# Patient Record
Sex: Female | Born: 1935 | Race: White | Hispanic: No | Smoking: Never smoker
Health system: Southern US, Community
[De-identification: ages and names within clinical notes are randomized; demographics above are authoritative.]

## PROBLEM LIST (undated history)

## (undated) DIAGNOSIS — I4891 Unspecified atrial fibrillation: Secondary | ICD-10-CM

## (undated) DIAGNOSIS — F0390 Unspecified dementia without behavioral disturbance: Secondary | ICD-10-CM

## (undated) DIAGNOSIS — G47419 Narcolepsy without cataplexy: Secondary | ICD-10-CM

## (undated) DIAGNOSIS — I10 Essential (primary) hypertension: Secondary | ICD-10-CM

---

## 2004-01-27 ENCOUNTER — Ambulatory Visit: Payer: Self-pay | Admitting: Family Medicine

## 2004-03-10 ENCOUNTER — Ambulatory Visit: Payer: Self-pay | Admitting: Gastroenterology

## 2005-09-27 ENCOUNTER — Ambulatory Visit: Payer: Self-pay | Admitting: Family Medicine

## 2007-12-17 ENCOUNTER — Ambulatory Visit: Payer: Self-pay | Admitting: Family Medicine

## 2008-01-22 ENCOUNTER — Ambulatory Visit: Payer: Self-pay | Admitting: Family Medicine

## 2009-01-25 ENCOUNTER — Ambulatory Visit: Payer: Self-pay | Admitting: Family Medicine

## 2010-11-22 ENCOUNTER — Inpatient Hospital Stay: Payer: Self-pay | Admitting: Unknown Physician Specialty

## 2010-11-25 LAB — PATHOLOGY REPORT

## 2010-11-29 ENCOUNTER — Encounter: Payer: Self-pay | Admitting: Internal Medicine

## 2010-11-30 ENCOUNTER — Ambulatory Visit: Payer: Self-pay | Admitting: Internal Medicine

## 2010-12-01 ENCOUNTER — Encounter: Payer: Self-pay | Admitting: Internal Medicine

## 2010-12-31 ENCOUNTER — Encounter: Payer: Self-pay | Admitting: Internal Medicine

## 2011-03-15 ENCOUNTER — Encounter: Payer: Self-pay | Admitting: Nurse Practitioner

## 2011-03-15 ENCOUNTER — Encounter: Payer: Self-pay | Admitting: Cardiothoracic Surgery

## 2011-03-31 ENCOUNTER — Encounter: Payer: Self-pay | Admitting: Cardiothoracic Surgery

## 2011-03-31 ENCOUNTER — Encounter: Payer: Self-pay | Admitting: Nurse Practitioner

## 2011-05-01 ENCOUNTER — Encounter: Payer: Self-pay | Admitting: Cardiothoracic Surgery

## 2011-05-01 ENCOUNTER — Encounter: Payer: Self-pay | Admitting: Nurse Practitioner

## 2011-05-31 ENCOUNTER — Encounter: Payer: Self-pay | Admitting: Cardiothoracic Surgery

## 2011-05-31 ENCOUNTER — Encounter: Payer: Self-pay | Admitting: Nurse Practitioner

## 2012-02-22 ENCOUNTER — Emergency Department: Payer: Self-pay | Admitting: Emergency Medicine

## 2012-08-23 IMAGING — NM NM LUNG SCAN
2 series · 16 of 16 positions shown · non-contrast
Comparison: none

REASON FOR EXAM: STAT Lbnat Lii [REDACTED] shortness of breath eval for
PE recent surgery 10...
COMMENTS:

[Series 1000: lung perfusion · 1.95mm/px · 4 acquisitions, 8 frames shown]
[im 1/4]
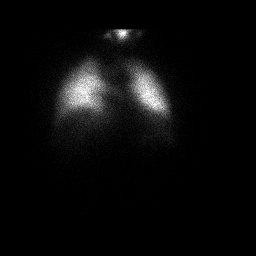
[im 1/4]
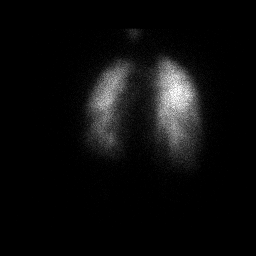
[im 2/4]
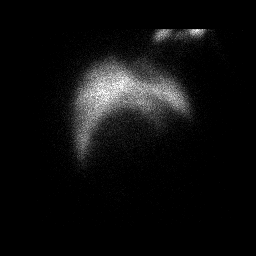
[im 2/4]
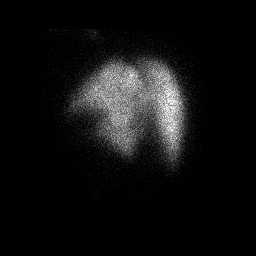
[im 3/4]
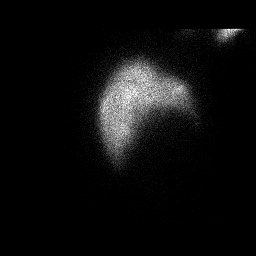
[im 3/4]
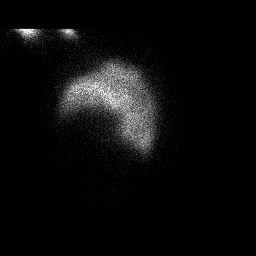
[im 4/4]
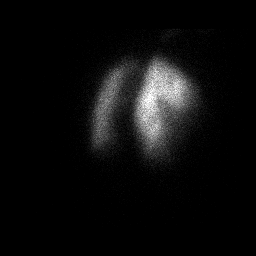
[im 4/4]
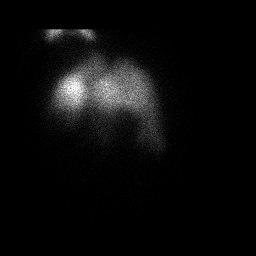

[Series 1000: lung ventilation · 3.90mm/px · 4 acquisitions, 8 frames shown]
[im 1/4]
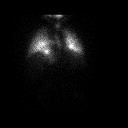
[im 1/4]
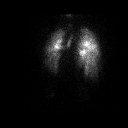
[im 2/4]
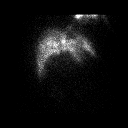
[im 2/4]
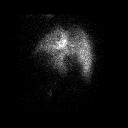
[im 3/4]
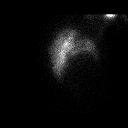
[im 3/4]
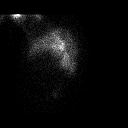
[im 4/4]
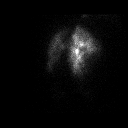
[im 4/4]
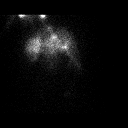

[16 of 16 positions shown; findings below may reference images not displayed]

PROCEDURE:     NM  - NM VQ LUNG SCAN  - [DATE] [DATE] [DATE]  [DATE]

RESULT:     Following aerosol administration of 39.712 mCi of 1255
DTPA, ventilation lung scan was performed in eight views. After the
ventilation scan and following intravenous administration of 4.128 mCi
technetium 99m macroaggregated albumin, perfusion lung scan was performed in
eight views. There is a heterogeneous distribution of tracer activity in the
ventilation scan consistent with ventilatory disease. No mismatched
perfusion defects indicative of pulmonary embolus are identified.
IMPRESSION: 1.     Ventilation perfusion lung scan is low probability for pulmonary
embolus.

## 2014-01-11 ENCOUNTER — Emergency Department: Payer: Self-pay | Admitting: Emergency Medicine

## 2014-01-11 LAB — URINALYSIS, COMPLETE
BACTERIA: NONE SEEN
Bilirubin,UR: NEGATIVE
Glucose,UR: NEGATIVE mg/dL (ref 0–75)
Ketone: NEGATIVE
LEUKOCYTE ESTERASE: NEGATIVE
Nitrite: NEGATIVE
PH: 5 (ref 4.5–8.0)
Protein: NEGATIVE
RBC,UR: 1 /HPF (ref 0–5)
SPECIFIC GRAVITY: 1.01 (ref 1.003–1.030)

## 2014-01-11 LAB — CBC WITH DIFFERENTIAL/PLATELET
BASOS ABS: 0 10*3/uL (ref 0.0–0.1)
BASOS PCT: 0.4 %
EOS ABS: 0.3 10*3/uL (ref 0.0–0.7)
Eosinophil %: 3.9 %
HCT: 44.4 % (ref 35.0–47.0)
HGB: 14.3 g/dL (ref 12.0–16.0)
Lymphocyte #: 3.3 10*3/uL (ref 1.0–3.6)
Lymphocyte %: 40.7 %
MCH: 31.5 pg (ref 26.0–34.0)
MCHC: 32.3 g/dL (ref 32.0–36.0)
MCV: 98 fL (ref 80–100)
Monocyte #: 0.9 x10 3/mm (ref 0.2–0.9)
Monocyte %: 10.7 %
Neutrophil #: 3.6 10*3/uL (ref 1.4–6.5)
Neutrophil %: 44.3 %
Platelet: 272 10*3/uL (ref 150–440)
RBC: 4.55 10*6/uL (ref 3.80–5.20)
RDW: 13.6 % (ref 11.5–14.5)
WBC: 8.2 10*3/uL (ref 3.6–11.0)

## 2014-01-11 LAB — CK: CK, Total: 82 U/L (ref 26–192)

## 2014-01-11 LAB — TROPONIN I: Troponin-I: <0.02 ng/mL

## 2016-01-04 ENCOUNTER — Observation Stay: Admit: 2016-01-04 | Payer: Medicare Other

## 2016-01-04 ENCOUNTER — Encounter: Payer: Self-pay | Admitting: Emergency Medicine

## 2016-01-04 ENCOUNTER — Emergency Department: Payer: Medicare Other

## 2016-01-04 ENCOUNTER — Observation Stay
Admission: EM | Admit: 2016-01-04 | Discharge: 2016-01-05 | Disposition: A | Discharge status: Home / Self Care | Payer: Medicare Other | Attending: Internal Medicine | Admitting: Internal Medicine

## 2016-01-04 DIAGNOSIS — I4891 Unspecified atrial fibrillation: Secondary | ICD-10-CM | POA: Insufficient documentation

## 2016-01-04 DIAGNOSIS — G47419 Narcolepsy without cataplexy: Secondary | ICD-10-CM | POA: Diagnosis not present

## 2016-01-04 DIAGNOSIS — F0390 Unspecified dementia without behavioral disturbance: Secondary | ICD-10-CM | POA: Diagnosis not present

## 2016-01-04 DIAGNOSIS — I10 Essential (primary) hypertension: Secondary | ICD-10-CM | POA: Insufficient documentation

## 2016-01-04 DIAGNOSIS — R079 Chest pain, unspecified: Secondary | ICD-10-CM | POA: Diagnosis not present

## 2016-01-04 DIAGNOSIS — R4182 Altered mental status, unspecified: Secondary | ICD-10-CM | POA: Diagnosis not present

## 2016-01-04 DIAGNOSIS — Z7982 Long term (current) use of aspirin: Secondary | ICD-10-CM | POA: Diagnosis not present

## 2016-01-04 DIAGNOSIS — Z79899 Other long term (current) drug therapy: Secondary | ICD-10-CM | POA: Diagnosis not present

## 2016-01-04 DIAGNOSIS — R55 Syncope and collapse: Secondary | ICD-10-CM | POA: Diagnosis present

## 2016-01-04 HISTORY — DX: Essential (primary) hypertension: I10

## 2016-01-04 HISTORY — DX: Narcolepsy without cataplexy: G47.419

## 2016-01-04 LAB — COMPREHENSIVE METABOLIC PANEL
ALT: 11 U/L — AB (ref 14–54)
AST: 20 U/L (ref 15–41)
Albumin: 3.7 g/dL (ref 3.5–5.0)
Alkaline Phosphatase: 45 U/L (ref 38–126)
Anion gap: 8 (ref 5–15)
BUN: 19 mg/dL (ref 6–20)
CO2: 27 mmol/L (ref 22–32)
CREATININE: 1.07 mg/dL — AB (ref 0.44–1.00)
Calcium: 10.1 mg/dL (ref 8.9–10.3)
Chloride: 102 mmol/L (ref 101–111)
GFR, EST AFRICAN AMERICAN: 52 mL/min — AB (ref >60)
GFR, EST NON AFRICAN AMERICAN: 45 mL/min — AB (ref >60)
Glucose, Bld: 100 mg/dL — ABNORMAL HIGH (ref 65–99)
Potassium: 4.1 mmol/L (ref 3.5–5.1)
Sodium: 137 mmol/L (ref 135–145)
Total Bilirubin: 0.6 mg/dL (ref 0.3–1.2)
Total Protein: 7.3 g/dL (ref 6.5–8.1)

## 2016-01-04 LAB — TROPONIN I
TROPONIN I: 0.03 ng/mL — AB (ref <0.03)
Troponin I: 0.03 ng/mL
Troponin I: <0.03 ng/mL (ref <0.03)

## 2016-01-04 LAB — URINALYSIS, COMPLETE (UACMP) WITH MICROSCOPIC
Bacteria, UA: NONE SEEN
Bilirubin Urine: NEGATIVE
GLUCOSE, UA: NEGATIVE mg/dL
HGB URINE DIPSTICK: NEGATIVE
Ketones, ur: 5 mg/dL — AB
Leukocytes, UA: NEGATIVE
NITRITE: NEGATIVE
PH: 5 (ref 5.0–8.0)
Protein, ur: NEGATIVE mg/dL
Specific Gravity, Urine: 1.017 (ref 1.005–1.030)

## 2016-01-04 LAB — CBC WITH DIFFERENTIAL/PLATELET
BASOS ABS: 0.1 10*3/uL (ref 0–0.1)
Basophils Relative: 1 %
EOS PCT: 2 %
Eosinophils Absolute: 0.2 10*3/uL (ref 0–0.7)
HCT: 43.9 % (ref 35.0–47.0)
Hemoglobin: 14.8 g/dL (ref 12.0–16.0)
LYMPHS PCT: 21 %
Lymphs Abs: 1.9 10*3/uL (ref 1.0–3.6)
MCH: 31.6 pg (ref 26.0–34.0)
MCHC: 33.7 g/dL (ref 32.0–36.0)
MCV: 93.9 fL (ref 80.0–100.0)
Monocytes Absolute: 0.7 10*3/uL (ref 0.2–0.9)
Monocytes Relative: 7 %
NEUTROS ABS: 6.4 10*3/uL (ref 1.4–6.5)
Neutrophils Relative %: 69 %
PLATELETS: 403 10*3/uL (ref 150–440)
RBC: 4.67 MIL/uL (ref 3.80–5.20)
RDW: 13.1 % (ref 11.5–14.5)
WBC: 9.3 10*3/uL (ref 3.6–11.0)

## 2016-01-04 LAB — MRSA PCR SCREENING: MRSA BY PCR: NEGATIVE

## 2016-01-04 MED ORDER — HEPARIN SODIUM (PORCINE) 5000 UNIT/ML IJ SOLN
5000.0000 [IU] | Freq: Three times a day (TID) | INTRAMUSCULAR | Status: DC
  Administered 2016-01-04 – 2016-01-05 (×3): 5000 [IU] via SUBCUTANEOUS
  Filled 2016-01-04 (×3): qty 1

## 2016-01-04 MED ORDER — ALBUTEROL SULFATE (2.5 MG/3ML) 0.083% IN NEBU: 2.5000 mg | INHALATION_SOLUTION | Freq: Four times a day (QID) | RESPIRATORY_TRACT | Status: DC | PRN

## 2016-01-04 MED ORDER — SODIUM CHLORIDE 0.9% FLUSH: 3.0000 mL | Freq: Two times a day (BID) | INTRAVENOUS | Status: DC

## 2016-01-04 MED ORDER — ASPIRIN EC 81 MG PO TBEC
81.0000 mg | DELAYED_RELEASE_TABLET | Freq: Every day | ORAL | Status: DC
  Administered 2016-01-05: 81 mg via ORAL
  Filled 2016-01-04: qty 1

## 2016-01-04 MED ORDER — SODIUM CHLORIDE 0.9 % IV SOLN
INTRAVENOUS | Status: DC
  Administered 2016-01-04: 18:00:00 via INTRAVENOUS

## 2016-01-04 MED ORDER — NYSTATIN 100000 UNIT/GM EX POWD
Freq: Three times a day (TID) | CUTANEOUS | Status: DC
  Administered 2016-01-04 – 2016-01-05 (×3): via TOPICAL
  Filled 2016-01-04: qty 15

## 2016-01-04 NOTE — H&P (Signed)
[facility] - [place] at [facility]   PATIENT NAME: Joy Hill    MR#:  [id number]  DATE OF BIRTH:  22-Feb-1925  DATE OF ADMISSION:  01/04/2016  PRIMARY CARE PHYSICIAN: No primary care provider on file.   REQUESTING/REFERRING PHYSICIAN: Lord  CHIEF COMPLAINT:  No chief complaint on file.   HISTORY OF PRESENT ILLNESS: Joy Hill  is a 82 y.o. female with a known history of  Dementia,  Hypertension-   recently move to assisted living place 15 days ago, daughter goes and visits her every evening. Last evening when daughter and patient was completely back to her baseline status. Today morning after eating breakfast when patient was talking to another resident she suddenly stopped responding and her eyes rolled back. So the other resident called for help and patient was brought to the emergency room. The episode lasted for a few seconds without any loss of control on the urine or bladder. They called patient's daughter and she knows this information as she talked to the assisted living place. Currently patient is alert but as per daughter she is slightly confused. She was also noted to be having atrial fibrillation which daughter was not aware any time in the past.  PAST MEDICAL HISTORY:   Past Medical History:  Diagnosis Date  . Dementia   . Hypertension   . Narcolepsy     PAST SURGICAL HISTORY: History reviewed. No pertinent surgical history.  SOCIAL HISTORY:  Social History  Substance Use Topics  . Smoking status: Unknown If Ever Smoked  . Smokeless tobacco: Not on file  . Alcohol use No    FAMILY HISTORY:  Family History  Problem Relation Age of Onset  . Breast cancer Paternal Grandmother     DRUG ALLERGIES: No Known Allergies  REVIEW OF SYSTEMS:   CONSTITUTIONAL: No fever, fatigue or weakness.  EYES: No blurred or double vision.  EARS, NOSE, AND THROAT: No tinnitus or ear pain.  RESPIRATORY: No cough, shortness of breath, wheezing or  hemoptysis.  CARDIOVASCULAR: No chest pain, orthopnea, edema.  GASTROINTESTINAL: No nausea, vomiting, diarrhea or abdominal pain.  GENITOURINARY: No dysuria, hematuria.  ENDOCRINE: No polyuria, nocturia,  HEMATOLOGY: No anemia, easy bruising or bleeding SKIN: No rash or lesion. MUSCULOSKELETAL: No joint pain or arthritis.   NEUROLOGIC: No tingling, numbness, weakness.  PSYCHIATRY: No anxiety or depression.   MEDICATIONS AT HOME:  Prior to Admission medications   Medication Sig Start Date End Date Taking? Authorizing Provider  albuterol (PROVENTIL HFA;VENTOLIN HFA) 108 (90 Base) MCG/ACT inhaler Inhale 1-2 puffs into the lungs every 4 (four) hours as needed.  07/15/14  Yes Historical Provider, MD  aspirin EC 81 MG tablet Take by mouth daily.    Yes Historical Provider, MD  dextroamphetamine (DEXEDRINE SPANSULE) 10 MG 24 hr capsule Take by mouth. 12/24/15 01/22/16 Yes Historical Provider, MD  nystatin (MYCOSTATIN/NYSTOP) powder Apply topically every 8 (eight) hours as needed.  11/24/15 11/23/16 Yes Historical Provider, MD      PHYSICAL EXAMINATION:   VITAL SIGNS: Blood pressure 106/74, pulse 85, temperature 98.1 F (36.7 C), temperature source Oral, resp. rate 19, SpO2 97 %.  GENERAL:  82 y.o.-year-old patient lying in the bed with no acute distress.  EYES: Pupils equal, round, reactive to light and accommodation. No scleral icterus. Extraocular muscles intact.  HEENT: Head atraumatic, normocephalic. Oropharynx and nasopharynx clear.  NECK:  Supple, no jugular venous distention. No thyroid enlargement, no tenderness.  LUNGS: Normal breath sounds bilaterally, no wheezing, rales,rhonchi or  crepitation. No use of accessory muscles of respiration.  CARDIOVASCULAR: S1, S2 normal. No murmurs, rubs, or gallops.  ABDOMEN: Soft, nontender, nondistended. Bowel sounds present. No organomegaly or mass.  EXTREMITIES: No pedal edema, cyanosis, or clubbing.  NEUROLOGIC: Cranial nerves II through XII  are intact. Muscle strength 4/5 in all extremities. Sensation intact. Gait not checked.  PSYCHIATRIC: The patient is alert and oriented x 2.  SKIN: No obvious rash, lesion, or ulcer.   LABORATORY PANEL:   CBC  Recent Labs Lab 01/04/16 1221  WBC 9.3  HGB 14.8  HCT 43.9  PLT 403  MCV 93.9  MCH 31.6  MCHC 33.7  RDW 13.1  LYMPHSABS 1.9  MONOABS 0.7  EOSABS 0.2  BASOSABS 0.1   ------------------------------------------------------------------------------------------------------------------  Chemistries   Recent Labs Lab 01/04/16 1221  NA 137  K 4.1  CL 102  CO2 27  GLUCOSE 100*  BUN 19  CREATININE 1.07*  CALCIUM 10.1  AST 20  ALT 11*  ALKPHOS 45  BILITOT 0.6   ------------------------------------------------------------------------------------------------------------------ CrCl cannot be calculated (Unknown ideal weight.). ------------------------------------------------------------------------------------------------------------------ No results for input(s): TSH, T4TOTAL, T3FREE, THYROIDAB in the last 72 hours.  Invalid input(s): FREET3   Coagulation profile No results for input(s): INR, PROTIME in the last 168 hours. ------------------------------------------------------------------------------------------------------------------- No results for input(s): DDIMER in the last 72 hours. -------------------------------------------------------------------------------------------------------------------  Cardiac Enzymes  Recent Labs Lab 01/04/16 1221  TROPONINI 0.03*   ------------------------------------------------------------------------------------------------------------------ Invalid input(s): POCBNP  ---------------------------------------------------------------------------------------------------------------  Urinalysis    Component Value Date/Time   COLORURINE YELLOW (A) 01/04/2016 1221   APPEARANCEUR CLEAR (A) 01/04/2016 1221    APPEARANCEUR Clear 01/11/2014 0215   LABSPEC 1.017 01/04/2016 1221   LABSPEC 1.010 01/11/2014 0215   PHURINE 5.0 01/04/2016 1221   GLUCOSEU NEGATIVE 01/04/2016 1221   GLUCOSEU Negative 01/11/2014 0215   HGBUR NEGATIVE 01/04/2016 1221   BILIRUBINUR NEGATIVE 01/04/2016 1221   BILIRUBINUR Negative 01/11/2014 0215   KETONESUR 5 (A) 01/04/2016 1221   PROTEINUR NEGATIVE 01/04/2016 1221   NITRITE NEGATIVE 01/04/2016 1221   LEUKOCYTESUR NEGATIVE 01/04/2016 1221   LEUKOCYTESUR Negative 01/11/2014 0215     RADIOLOGY: Ct Head Wo Contrast  Result Date: 01/04/2016 CLINICAL DATA:  Altered mental status EXAM: CT HEAD WITHOUT CONTRAST TECHNIQUE: Contiguous axial images were obtained from the base of the skull through the vertex without intravenous contrast. COMPARISON:  01/11/2014 FINDINGS: Brain: Diffuse atrophic changes are again identified similar to that noted on the prior exam. No findings to suggest acute hemorrhage, acute infarction or space-occupying mass lesion are noted. Vascular: No hyperdense vessel or unexpected calcification. Skull: Normal. Negative for fracture or focal lesion. Sinuses/Orbits: No acute finding. Other: None. IMPRESSION: Chronic atrophic changes without acute abnormality. Electronically Signed   By: Alcide CleverMark  Lukens M.D.   On: 01/04/2016 13:10    EKG: Orders placed or performed during the hospital encounter of 01/04/16  . EKG 12-Lead  . EKG 12-Lead    IMPRESSION AND PLAN:  * Syncopal episode   Patient has new onset atrial fibrillation, we'll monitor on telemetry and electromyogram.   Give gentle IV hydration.  * New-onset atrial fibrillation   Because of patient's old age she is not a candidate for anticoagulation at this time, CT head is negative. We'll monitor on telemetry and get echocardiogram.  * History of hypertension   As per daughter patient was currently not taking any medication for hypertension.   Continue monitoring.  * yeast underneath both breasts    Nystatin powder.  All the records are reviewed and  case discussed with ED provider. Management plans discussed with the patient, family and they are in agreement.  CODE STATUS: Full code Code Status History    This patient does not have a recorded code status. Please follow your organizational policy for patients in this situation.     Patient's daughter who is healthcare power of attorney was present in the room during my visit.  TOTAL TIME TAKING CARE OF THIS PATIENT: 50 minutes.    Altamese DillingVACHHANI, Shavon Ashmore M.D on 01/04/2016   Between 7am to 6pm - Pager - [phone]  After 6pm go to [email or web address] - Social research officer, governmentpassword EPAS [facility]  [facility]  Office  [phone]  CC: Primary care physician; No primary care provider on file.   Note: This dictation was prepared with Dragon dictation along with smaller phrase technology. Any transcriptional errors that result from this process are unintentional.

## 2016-01-04 NOTE — ED Notes (Signed)
Patient transported to CT 

## 2016-01-04 NOTE — Progress Notes (Signed)
Family Meeting Note  Advance Directive:yes  Today a meeting took place with the daughter- HCPOA.  Patient is unable to participate due [id number]:Lacked capacity confusion   The following clinical team members were present during this meeting:MD and RN  The following were discussed:Patient's diagnosis: , Patient's progosis: Unable to determine and Goals for treatment: Full Code  Additional follow-up to be provided: PMD  Time spent during discussion:20 minutes  Aunica Dauphinee, Heath GoldVAIBHAVKUMAR, MD

## 2016-01-04 NOTE — ED Notes (Signed)
Family at bedside. RN updated

## 2016-01-04 NOTE — ED Notes (Signed)
Report called to floor nurse melonie rn   Pt taken to floor via stretcher.

## 2016-01-04 NOTE — ED Triage Notes (Signed)
Pt to ED via EMS from [facility], original call out was  for unconsciousness, hx of narcolepsy. Per EMS on arrival pt was altered, pt would not stand , was combative during ride. Pt baseline is A&Ox4 per facility. VS 152/70, 84HR, 98% on RA.

## 2016-01-04 NOTE — ED Notes (Signed)
Resumed care from [place] rn. Pt resting in bed with eyes closed.  Family at bedside.  Pt denies any pain.

## 2016-01-04 NOTE — ED Notes (Signed)
Pt awake  Family with pt.   

## 2016-01-04 NOTE — ED Provider Notes (Signed)
[facility] Emergency Department Provider Note ____________________________________________   I have reviewed the triage vital signs and the triage nursing note.  HISTORY  Chief Complaint No chief complaint on file. Altered mental status  Historian Level V cavity, patient is unable to provide her own history/poor historian History per EMS report and speaking with staff at [place] assisted living  HPI Rateel Beldin is a 82 y.o. female who is brought in by EMS with report of altered mental status. It was initially reported to me that the patient was in conversation with someone sitting down and then slumped over. EMS reports when they got there the patient was conscious and alert. They are reporting history of narcolepsy.  Patient herself is unable to state what happened. She states that she does not have narcolepsy. Denies focal weakness or numbness. Denies any pains. Denies chest discomfort.  I spoke with staff member who states that another resident reported they were talking while sitting and she had no warning and just slumped over. Staff member states that she was trying to wake her up and her eyes were rolled back. Took less than 5 minutes and she was conversing again. She's been in the facility for 3 weeks now and has had some trouble adjusting and is having some crying and depression symptoms secondary to that. They did not report facial droop. They thought she was a little confused when she came back from the syncope.    Past Medical History:  Diagnosis Date  . Narcolepsy     There are no active problems to display for this patient.   History reviewed. No pertinent surgical history.  Prior to Admission medications   Medication Sig Start Date End Date Taking? Authorizing Provider  albuterol (PROVENTIL HFA;VENTOLIN HFA) 108 (90 Base) MCG/ACT inhaler Inhale 1-2 puffs into the lungs every 4 (four) hours as needed.  07/15/14  Yes Historical Provider,  MD  aspirin EC 81 MG tablet Take by mouth daily.    Yes Historical Provider, MD  dextroamphetamine (DEXEDRINE SPANSULE) 10 MG 24 hr capsule Take by mouth. 12/24/15 01/22/16 Yes Historical Provider, MD  nystatin (MYCOSTATIN/NYSTOP) powder Apply topically every 8 (eight) hours as needed.  11/24/15 11/23/16 Yes Historical Provider, MD    No Known Allergies  No family history on file.  Social History Social History  Substance Use Topics  . Smoking status: Unknown If Ever Smoked  . Smokeless tobacco: Not on file  . Alcohol use Not on file    Review of Systems  Limited due to patient's mental status, but she denies chest pain, headache, trouble breathing, weakness, numbness, abdominal pain, vomiting  ____________________________________________   PHYSICAL EXAM:  VITAL SIGNS: ED Triage Vitals  Enc Vitals Group     BP 01/04/16 1213 (!) 139/103     Pulse Rate 01/04/16 1213 94     Resp 01/04/16 1213 16     Temp 01/04/16 1213 98.1 F (36.7 C)     Temp Source 01/04/16 1211 Oral     SpO2 01/04/16 1213 98 %     Weight --      Height --      Head Circumference --      Peak Flow --      Pain Score 01/04/16 1214 0     Pain Loc --      Pain Edu? --      Excl. in GC? --      Constitutional: Alert and cooperative, but poor historian. Well appearing and  in no distress. HEENT   Head: Normocephalic and atraumatic.      Eyes: Conjunctivae are normal. PERRL. Normal extraocular movements.      Ears:         Nose: No congestion/rhinnorhea.   Mouth/Throat: Mucous membranes are moist.   Neck: No stridor. Cardiovascular/Chest: Normal rate, irregularly irregular.  No murmurs, rubs, or gallops. Respiratory: Normal respiratory effort without tachypnea nor retractions. Breath sounds are clear and equal bilaterally. No wheezes/rales/rhonchi. Gastrointestinal: Soft. No distention, no guarding, no rebound. Nontender.  Obese.  Genitourinary/rectal: Deferred Musculoskeletal: Nontender  with normal range of motion in all extremities. No joint effusions.  No lower extremity tenderness.  No edema. Neurologic:  No slurred speech. No receptive or impression of aphasia. No facial droop.  No gross or focal neurologic deficits are appreciated. Skin:  Skin is warm, dry and intact. No rash noted. Psychiatric: Labile mood, occasionally tearful, can't state why   ____________________________________________  LABS (pertinent positives/negatives)  Labs Reviewed  URINALYSIS, COMPLETE (UACMP) WITH MICROSCOPIC - Abnormal; Notable for the following:       Result Value   Color, Urine YELLOW (*)    APPearance CLEAR (*)    Ketones, ur 5 (*)    Squamous Epithelial / LPF 0-5 (*)    All other components within normal limits  COMPREHENSIVE METABOLIC PANEL - Abnormal; Notable for the following:    Glucose, Bld 100 (*)    Creatinine, Ser 1.07 (*)    ALT 11 (*)    GFR calc non Af Amer 45 (*)    GFR calc Af Amer 52 (*)    All other components within normal limits  TROPONIN I - Abnormal; Notable for the following:    Troponin I 0.03 (*)    All other components within normal limits  CBC WITH DIFFERENTIAL/PLATELET    ____________________________________________    EKG I, Governor Rooksebecca Ellisa Devivo, MD, the attending physician have personally viewed and interpreted all ECGs.  80 bpm. Atrial fibrillation. Narrow QS. Left axis deviation. Nonspecific ST and T-wave ____________________________________________  RADIOLOGY All Xrays were viewed by me. Imaging interpreted by Radiologist.  CT head without contrast:  IMPRESSION: Chronic atrophic changes without acute abnormality. __________________________________________  PROCEDURES  Procedure(s) performed: None  Critical Care performed: None  ____________________________________________   ED COURSE / ASSESSMENT AND PLAN  Pertinent labs & imaging results that were available during my care of the patient were reviewed by me and considered in my  medical decision making (see chart for details).   By description it sounds like she had a possible syncopal episode while sitting. There is a past medical history noted for narcolepsy, but staff states that she is often difficult to wake when she sleeping, but they have never noted her to have a narcolepsy episode like what occurred today.  No seizure activity. No reported recent illnesses. No reported headache or neurologic complaints, an intact neurologic exam focally, but perhaps she is still altered. Staff states that her emotional state with labile motion is consistent with baseline for them, but the confusion is not.   Patient's daughter, Melody, did come to the ED. She states that her mother is still confused, does not feel like she is at baseline. Again on reexamination, no clear focal neurologic deficit. Head CT reviewed and negative. Laboratory evaluation is reassuring. Troponin initial 0.03. No complaint of chest discomfort or palpitations. She does have a history of A. fib and EKG as showing A. fib without tachycardia.  Given the fact that she is  not back to neurologic baseline, and the unusual syncope, I did discuss with patient and family recommendation for hospital admission. I spoke with the hospitalist for admission.  CONSULTATIONS:  Hospitalist for admission.   Patient / Family / Caregiver informed of clinical course, medical decision-making process, and agree with plan.   ___________________________________________   FINAL CLINICAL IMPRESSION(S) / ED DIAGNOSES   Final diagnoses:  Syncope, unspecified syncope type              Note: This dictation was prepared with Dragon dictation. Any transcriptional errors that result from this process are unintentional    Governor Rooksebecca Charlissa Petros, MD 01/04/16 [phone]

## 2016-01-05 ENCOUNTER — Observation Stay
Admit: 2016-01-05 | Discharge: 2016-01-05 | Disposition: A | Discharge status: Home / Self Care | Payer: Medicare Other | Attending: Internal Medicine | Admitting: Internal Medicine

## 2016-01-05 LAB — BASIC METABOLIC PANEL
ANION GAP: 6 (ref 5–15)
BUN: 16 mg/dL (ref 6–20)
CO2: 26 mmol/L (ref 22–32)
Calcium: 9.5 mg/dL (ref 8.9–10.3)
Chloride: 109 mmol/L (ref 101–111)
Creatinine, Ser: 0.88 mg/dL (ref 0.44–1.00)
GFR, EST NON AFRICAN AMERICAN: 57 mL/min — AB (ref >60)
GLUCOSE: 96 mg/dL (ref 65–99)
POTASSIUM: 3.9 mmol/L (ref 3.5–5.1)
Sodium: 141 mmol/L (ref 135–145)

## 2016-01-05 LAB — CBC
HEMATOCRIT: 37.8 % (ref 35.0–47.0)
HEMOGLOBIN: 13 g/dL (ref 12.0–16.0)
MCH: 32.4 pg (ref 26.0–34.0)
MCHC: 34.5 g/dL (ref 32.0–36.0)
MCV: 93.9 fL (ref 80.0–100.0)
Platelets: 364 10*3/uL (ref 150–440)
RBC: 4.03 MIL/uL (ref 3.80–5.20)
RDW: 13.5 % (ref 11.5–14.5)
WBC: 5.9 10*3/uL (ref 3.6–11.0)

## 2016-01-05 LAB — ECHOCARDIOGRAM COMPLETE
Height: 63 in
Weight: 3313.6 oz

## 2016-01-05 LAB — TROPONIN I: TROPONIN I: 0.03 ng/mL — AB (ref <0.03)

## 2016-01-05 MED ORDER — ACETAMINOPHEN 325 MG PO TABS
650.0000 mg | ORAL_TABLET | ORAL | Status: DC | PRN
  Filled 2016-01-05: qty 2

## 2016-01-05 NOTE — Progress Notes (Signed)
*  PRELIMINARY RESULTS* Echocardiogram 2D Echocardiogram has been performed.  Cristela BlueHege, Liberti Appleton 01/05/2016, 8:40 AM

## 2016-01-05 NOTE — Care Management Obs Status (Signed)
MEDICARE OBSERVATION STATUS NOTIFICATION   Patient Details  Name: Joy Hill MRN: [id number] Date of Birth: 08/04/1925   Medicare Observation Status Notification Given:  Yes    [name], RN 01/05/2016, 10:27 AM

## 2016-01-05 NOTE — Discharge Summary (Signed)
[facility] [place] [facility]   PATIENT NAME: Joy Hill    MR#:  [id number]  DATE OF BIRTH:  Jul 31, 1925  DATE OF ADMISSION:  01/04/2016 ADMITTING PHYSICIAN: Altamese Dilling, MD  DATE OF DISCHARGE: 01/05/16  PRIMARY CARE PHYSICIAN: No primary care provider on file.    ADMISSION DIAGNOSIS:  Syncope, unspecified syncope type [R55]  DISCHARGE DIAGNOSIS:  Principal Problem:   Syncopal episodes Active Problems:   Syncope   SECONDARY DIAGNOSIS:   Past Medical History:  Diagnosis Date  . Hypertension   . Narcolepsy     HOSPITAL COURSE:  Joy Hill  is a 82 y.o. female admitted 01/04/2016 with chief complaint syncope. Please see H&P performed by Altamese Dilling, MD for further information. Patient presented after syncopal episode. No head trauma - per report eyes rolled back then unresponsive while eating. Workup negative for cardiac cause.  Per daughter patient has history of narcolepsy   DISCHARGE CONDITIONS:   stable  CONSULTS OBTAINED:    DRUG ALLERGIES:  No Known Allergies  DISCHARGE MEDICATIONS:   Current Discharge Medication List    CONTINUE these medications which have NOT CHANGED   Details  albuterol (PROVENTIL HFA;VENTOLIN HFA) 108 (90 Base) MCG/ACT inhaler Inhale 1-2 puffs into the lungs every 4 (four) hours as needed.     aspirin EC 81 MG tablet Take by mouth daily.     dextroamphetamine (DEXEDRINE SPANSULE) 10 MG 24 hr capsule Take by mouth.    nystatin (MYCOSTATIN/NYSTOP) powder Apply topically every 8 (eight) hours as needed.          DISCHARGE INSTRUCTIONS:    DIET:  Regular diet  DISCHARGE CONDITION:  Stable  ACTIVITY:  Activity as tolerated  OXYGEN:  Home Oxygen: No.   Oxygen Delivery: room air  DISCHARGE LOCATION:  home   If you experience worsening of your admission symptoms, develop shortness of breath, life threatening emergency, suicidal or homicidal thoughts you must seek  medical attention immediately by calling 911 or calling your MD immediately  if symptoms less severe.  You Must read complete instructions/literature along with all the possible adverse reactions/side effects for all the Medicines you take and that have been prescribed to you. Take any new Medicines after you have completely understood and accpet all the possible adverse reactions/side effects.   Please note  You were cared for by a hospitalist during your hospital stay. If you have any questions about your discharge medications or the care you received while you were in the hospital after you are discharged, you can call the unit and asked to speak with the hospitalist on call if the hospitalist that took care of you is not available. Once you are discharged, your primary care physician will handle any further medical issues. Please note that NO REFILLS for any discharge medications will be authorized once you are discharged, as it is imperative that you return to your primary care physician (or establish a relationship with a primary care physician if you do not have one) for your aftercare needs so that they can reassess your need for medications and monitor your lab values.    On the day of Discharge:   VITAL SIGNS:  Blood pressure 131/71, pulse 69, temperature 97.7 F (36.5 C), temperature source Oral, resp. rate 18, height 5\' 3"  (1.6 m), weight 93.9 kg (207 lb 1.6 oz), SpO2 94 %.  I/O:   Intake/Output Summary (Last 24 hours) at 01/05/16 1019 Last data filed at 01/05/16 1000  Gross per 24 hour  Intake          1641.67 ml  Output                2 ml  Net          1639.67 ml    PHYSICAL EXAMINATION:  GENERAL:  82 y.o.-year-old patient lying in the bed with no acute distress.  EYES: Pupils equal, round, reactive to light and accommodation. No scleral icterus. Extraocular muscles intact.  HEENT: Head atraumatic, normocephalic. Oropharynx and nasopharynx clear.  NECK:  Supple, no  jugular venous distention. No thyroid enlargement, no tenderness.  LUNGS: Normal breath sounds bilaterally, no wheezing, rales,rhonchi or crepitation. No use of accessory muscles of respiration.  CARDIOVASCULAR: S1, S2 normal. No murmurs, rubs, or gallops.  ABDOMEN: Soft, non-tender, non-distended. Bowel sounds present. No organomegaly or mass.  EXTREMITIES: No pedal edema, cyanosis, or clubbing.  NEUROLOGIC: Cranial nerves II through XII are intact. Muscle strength 5/5 in all extremities. Sensation intact. Gait not checked.  PSYCHIATRIC: sleeping but arousable, The patient is alert and oriented x 3.  SKIN: No obvious rash, lesion, or ulcer.   DATA REVIEW:   CBC  Recent Labs Lab 01/05/16 0215  WBC 5.9  HGB 13.0  HCT 37.8  PLT 364    Chemistries   Recent Labs Lab 01/04/16 1221 01/05/16 0215  NA 137 141  K 4.1 3.9  CL 102 109  CO2 27 26  GLUCOSE 100* 96  BUN 19 16  CREATININE 1.07* 0.88  CALCIUM 10.1 9.5  AST 20  --   ALT 11*  --   ALKPHOS 45  --   BILITOT 0.6  --     Cardiac Enzymes  Recent Labs Lab 01/05/16 0215  TROPONINI 0.03*    Microbiology Results  Results for orders placed or performed during the hospital encounter of 01/04/16  MRSA PCR Screening     Status: None   Collection Time: 01/04/16  7:18 PM  Result Value Ref Range Status   MRSA by PCR NEGATIVE NEGATIVE Final    Comment:        The GeneXpert MRSA Assay (FDA approved for NASAL specimens only), is one component of a comprehensive MRSA colonization surveillance program. It is not intended to diagnose MRSA infection nor to guide or monitor treatment for MRSA infections.     RADIOLOGY:  Ct Head Wo Contrast  Result Date: 01/04/2016 CLINICAL DATA:  Altered mental status EXAM: CT HEAD WITHOUT CONTRAST TECHNIQUE: Contiguous axial images were obtained from the base of the skull through the vertex without intravenous contrast. COMPARISON:  01/11/2014 FINDINGS: Brain: Diffuse atrophic changes  are again identified similar to that noted on the prior exam. No findings to suggest acute hemorrhage, acute infarction or space-occupying mass lesion are noted. Vascular: No hyperdense vessel or unexpected calcification. Skull: Normal. Negative for fracture or focal lesion. Sinuses/Orbits: No acute finding. Other: None. IMPRESSION: Chronic atrophic changes without acute abnormality. Electronically Signed   By: Alcide CleverMark  Lukens M.D.   On: 01/04/2016 13:10     Management plans discussed with the patient, family and they are in agreement.  CODE STATUS:     Code Status Orders        Start     Ordered   01/04/16 1706  Full code  Continuous     01/04/16 1705    Code Status History    Date Active Date Inactive Code Status Order ID Comments User Context   This patient has  a current code status but no historical code status.      TOTAL TIME TAKING CARE OF THIS PATIENT: 33 minutes.    Hower,  Mardi MainlandDavid K M.D on 01/05/2016 at 10:19 AM  Between 7am to 6pm - Pager - [phone]  After 6pm go to [email or web address] - Scientist, research (life sciences)password EPAS [facility]  [facility]  Office  [phone]  CC: Primary care physician; No primary care provider on file.

## 2016-01-05 NOTE — Progress Notes (Signed)
Patient is from [facility] and [facility] hours and is ready for D/C. Per Lupita Leashonna RN at [facility] patient can return today and does not require an FL2 because patient is under observation and has been [facility] then [facility] hours. Patient's daughter Meliton RattanMelodie Beard is at bedside and will transport patient back to [facility] today via personal vehicle. RN will call report to Lupita LeashDonna. Please reconsult if future social work needs arise. CSW signing off.   Baker Hughes IncorporatedBailey Dezmon Conover, LCSW [phone]

## 2016-01-05 NOTE — Progress Notes (Signed)
On assessment patient alert and oriented to self (baseline). Patient heart sounds normal and breath sounds clear. Patient is afib on telemetry. Patient has no skin issues. SCD's on and heels off of mattress. Patient is incontinent on urine and bowel.   Report is being called to Maury Dusonna RN and daughter will be taking patient home via private vehicle.  Harvie HeckMelanie Fredrico Beedle, RN

## 2016-02-02 ENCOUNTER — Emergency Department: Payer: Medicare Other

## 2016-02-02 ENCOUNTER — Emergency Department
Admission: EM | Admit: 2016-02-02 | Discharge: 2016-02-02 | Disposition: A | Discharge status: Home / Self Care | Payer: Medicare Other | Attending: Emergency Medicine | Admitting: Emergency Medicine

## 2016-02-02 ENCOUNTER — Encounter: Payer: Self-pay | Admitting: Emergency Medicine

## 2016-02-02 DIAGNOSIS — Z79899 Other long term (current) drug therapy: Secondary | ICD-10-CM | POA: Insufficient documentation

## 2016-02-02 DIAGNOSIS — I10 Essential (primary) hypertension: Secondary | ICD-10-CM | POA: Insufficient documentation

## 2016-02-02 DIAGNOSIS — Z7982 Long term (current) use of aspirin: Secondary | ICD-10-CM | POA: Diagnosis not present

## 2016-02-02 DIAGNOSIS — M79604 Pain in right leg: Secondary | ICD-10-CM | POA: Diagnosis not present

## 2016-02-02 DIAGNOSIS — R52 Pain, unspecified: Secondary | ICD-10-CM

## 2016-02-02 DIAGNOSIS — M79605 Pain in left leg: Secondary | ICD-10-CM

## 2016-02-02 DIAGNOSIS — M25551 Pain in right hip: Secondary | ICD-10-CM | POA: Insufficient documentation

## 2016-02-02 LAB — COMPREHENSIVE METABOLIC PANEL
ALK PHOS: 44 U/L (ref 38–126)
ALT: 9 U/L — ABNORMAL LOW (ref 14–54)
ANION GAP: 5 (ref 5–15)
AST: 19 U/L (ref 15–41)
Albumin: 3.5 g/dL (ref 3.5–5.0)
BUN: 13 mg/dL (ref 6–20)
CALCIUM: 9.5 mg/dL (ref 8.9–10.3)
CO2: 28 mmol/L (ref 22–32)
Chloride: 106 mmol/L (ref 101–111)
Creatinine, Ser: 0.78 mg/dL (ref 0.44–1.00)
Glucose, Bld: 94 mg/dL (ref 65–99)
Potassium: 4.2 mmol/L (ref 3.5–5.1)
SODIUM: 139 mmol/L (ref 135–145)
Total Bilirubin: 1.2 mg/dL (ref 0.3–1.2)
Total Protein: 6.5 g/dL (ref 6.5–8.1)

## 2016-02-02 LAB — CBC
HCT: 40.6 % (ref 35.0–47.0)
Hemoglobin: 13.6 g/dL (ref 12.0–16.0)
MCH: 32 pg (ref 26.0–34.0)
MCHC: 33.5 g/dL (ref 32.0–36.0)
MCV: 95.6 fL (ref 80.0–100.0)
PLATELETS: 309 10*3/uL (ref 150–440)
RBC: 4.24 MIL/uL (ref 3.80–5.20)
RDW: 14.2 % (ref 11.5–14.5)
WBC: 6.9 10*3/uL (ref 3.6–11.0)

## 2016-02-02 LAB — TROPONIN I

## 2016-02-02 MED ORDER — ONDANSETRON HCL 4 MG/2ML IJ SOLN
4.0000 mg | Freq: Once | INTRAMUSCULAR | Status: AC
  Administered 2016-02-02: 4 mg via INTRAVENOUS

## 2016-02-02 MED ORDER — NALOXONE HCL 2 MG/2ML IJ SOSY
1.0000 mg | PREFILLED_SYRINGE | INTRAMUSCULAR | Status: DC | PRN
  Administered 2016-02-02: 1 mg via INTRAVENOUS

## 2016-02-02 MED ORDER — MORPHINE SULFATE (PF) 2 MG/ML IV SOLN
2.0000 mg | Freq: Once | INTRAVENOUS | Status: AC
  Administered 2016-02-02: 2 mg via INTRAVENOUS

## 2016-02-02 MED ORDER — ONDANSETRON HCL 4 MG/2ML IJ SOLN
INTRAMUSCULAR | Status: AC
  Administered 2016-02-02: 4 mg via INTRAVENOUS
  Filled 2016-02-02: qty 2

## 2016-02-02 MED ORDER — MORPHINE SULFATE (PF) 2 MG/ML IV SOLN
INTRAVENOUS | Status: AC
  Administered 2016-02-02: 2 mg via INTRAVENOUS
  Filled 2016-02-02: qty 1

## 2016-02-02 MED ORDER — NALOXONE HCL 2 MG/2ML IJ SOSY
PREFILLED_SYRINGE | INTRAMUSCULAR | Status: AC
  Administered 2016-02-02: 1 mg via INTRAVENOUS
  Filled 2016-02-02: qty 2

## 2016-02-02 NOTE — ED Notes (Signed)
Pt's oxygen saturation dropped to 88%. MD order for narcan; see mar.

## 2016-02-02 NOTE — ED Notes (Signed)
Pt taken off of oxygen via nasal canula, room saturation of 95%.

## 2016-02-02 NOTE — ED Triage Notes (Addendum)
Pt arrived via ems from [facility]. EMS reports pt was found to be in excruciating pain. When asked what is wrong pt responds "I don't know." When asked where pt hurts pt states her legs. Pedal pulses found bilaterally with doppler. Pt's legs painful to touch. Pt is a poor historian. No family at bedside.

## 2016-02-02 NOTE — ED Notes (Signed)
Pt's daughter at bedside and reports facility called ems for chest pain. Will contact facility for a better understanding and more details.

## 2016-02-02 NOTE — ED Notes (Addendum)
Gave report to Misty StanleyLisa, Charity fundraiserN at [facility].

## 2016-02-02 NOTE — ED Notes (Signed)
Facility contacted; nurse reports walking down hall and hearing pt scream when entering room nurse reports asking pt if she had any pain, including chest pain. Pt answered, "yes." EMS was then called by facility.

## 2016-02-02 NOTE — ED Notes (Signed)
Pt's oxygen saturation dropped to 86%, pt placed on 2L via nasal canula.

## 2016-02-02 NOTE — ED Provider Notes (Signed)
[facility] Emergency Department Provider Note    First MD Initiated Contact with Patient 02/02/16 [phone]     (approximate)  I have reviewed the triage vital signs and the nursing notes.  History Limited secondary to dementia breath HISTORY  Chief Complaint Leg Pain    HPI Joy DaviesCarrie Lovvorn is a 83 y.o. female with below list of chronic medical conditions including dementia presents via EMS from [facility] with complaint of lower extremity pain. Patient denies any trauma   Past Medical History:  Diagnosis Date  . Hypertension   . Narcolepsy     Patient Active Problem List   Diagnosis Date Noted  . Syncopal episodes 01/04/2016  . Syncope 01/04/2016    History reviewed. No pertinent surgical history.  Prior to Admission medications   Medication Sig Start Date End Date Taking? Authorizing Provider  acetaminophen (TYLENOL) 500 MG tablet Take 500 mg by mouth daily.   Yes Historical Provider, MD  albuterol (PROVENTIL HFA;VENTOLIN HFA) 108 (90 Base) MCG/ACT inhaler Inhale 1-2 puffs into the lungs every 4 (four) hours as needed.  07/15/14  Yes Historical Provider, MD  aspirin EC 81 MG tablet Take by mouth daily.    Yes Historical Provider, MD  dextroamphetamine (DEXEDRINE SPANSULE) 10 MG 24 hr capsule Take 10 mg by mouth daily.   Yes Historical Provider, MD  nystatin (MYCOSTATIN/NYSTOP) powder Apply topically every 8 (eight) hours as needed.  11/24/15 11/23/16 Yes Historical Provider, MD    Allergies Patient has no known allergies.  Family History  Problem Relation Age of Onset  . Breast cancer Paternal Grandmother     Social History Social History  Substance Use Topics  . Smoking status: Never Smoker  . Smokeless tobacco: Never Used  . Alcohol use No    Review of Systems Constitutional: No fever/chills Eyes: No visual changes. ENT: No sore throat. Cardiovascular: Denies chest pain. Respiratory: Denies shortness of  breath. Gastrointestinal: No abdominal pain.  No nausea, no vomiting.  No diarrhea.  No constipation. Genitourinary: Negative for dysuria. Musculoskeletal: Negative for back pain. Skin: Negative for rash. Neurological: Negative for headaches, focal weakness or numbness.  10-point ROS otherwise negative.  ____________________________________________   PHYSICAL EXAM:  VITAL SIGNS: ED Triage Vitals  Enc Vitals Group     BP 02/02/16 0450 126/83     Pulse Rate 02/02/16 0450 87     Resp 02/02/16 0450 18     Temp 02/02/16 0450 98.1 F (36.7 C)     Temp Source 02/02/16 0450 Oral     SpO2 02/02/16 0450 95 %     Weight 02/02/16 0453 220 lb 3.8 oz (99.9 kg)     Height 02/02/16 0453 5\' 8"  (1.727 m)     Head Circumference --      Peak Flow --      Pain Score 02/02/16 0625 Asleep     Pain Loc --      Pain Edu? --      Excl. in GC? --    Constitutional: Alert  Eyes: Conjunctivae are normal. PERRL. EOMI. Head: Atraumatic. Mouth/Throat: Mucous membranes are moist.  Oropharynx non-erythematous. Neck: No stridor. Cardiovascular: Normal rate, regular rhythm. Good peripheral circulation. Grossly normal heart sounds. Respiratory: Normal respiratory effort.  No retractions. Lungs CTAB. Gastrointestinal: Soft and nontender. No distention.  Musculoskeletal: Pain to palpation bilateral leg and with right hip palpation. No gross deformities of extremities. Neurologic:  Normal speech and language. No gross focal neurologic deficits are appreciated.  Skin:  Skin is warm, dry and intact. No rash noted.   ____________________________________________   LABS (all labs ordered are listed, but only abnormal results are displayed)  Labs Reviewed  COMPREHENSIVE METABOLIC PANEL - Abnormal; Notable for the following:       Result Value   ALT 9 (*)    All other components within normal limits  CBC  TROPONIN I   ____________________________________________  EKG  ED ECG REPORT I, [place] N  BROWN, the attending physician, personally viewed and interpreted this ECG.   Date: 02/02/2016  EKG Time: 4:52 AM  Rate: 86  Rhythm: Atrial fibrillation  Axis: Normal  Intervals: Irregular PR interval  ST&T Change: None  ____________________________________________  RADIOLOGY I, [place] N BROWN, personally viewed and evaluated these images (plain radiographs) as part of my medical decision making, as well as reviewing the written report by the radiologist.  US Venous Img Lower Bilateral  Result Date: 02/02/2016 CLINICAL DATA:  Bilateral lower extremity pain EXAM: BILATERAL LOWER EXTREMITY VENOUS DOPPLER ULTRASOUND TECHNIQUE: Gray-scale sonography with graded compression, as well as color Doppler and duplex ultrasound were performed to evaluate the lower extremity deep venous systems from the level of the common femoral vein and including the common femoral, femoral, profunda femoral, popliteal and calf veins including the posterior tibial, peroneal and gastrocnemius veins when visible. The superficial great saphenous vein was also interrogated. Spectral Doppler was utilized to evaluate flow at rest and with distal augmentation maneuvers in the common femoral, femoral and popliteal veins. COMPARISON:  None. FINDINGS: RIGHT LOWER EXTREMITY Common Femoral Vein: No evidence of thrombus. Normal compressibility, respiratory phasicity and response to augmentation. Saphenofemoral Junction: No evidence of thrombus. Normal compressibility and flow on color Doppler imaging. Profunda Femoral Vein: No evidence of thrombus. Normal compressibility and flow on color Doppler imaging. Femoral Vein: No evidence of thrombus. Normal compressibility, respiratory phasicity and response to augmentation. Popliteal Vein: No evidence of thrombus. Normal compressibility, respiratory phasicity and response to augmentation. Calf Veins: Not well seen. Superficial Great Saphenous Vein: No evidence of thrombus. Normal  compressibility and flow on color Doppler imaging. Venous Reflux:  None. Other Findings:  None. LEFT LOWER EXTREMITY Common Femoral Vein: No evidence of thrombus. Normal compressibility, respiratory phasicity and response to augmentation. Saphenofemoral Junction: No evidence of thrombus. Normal compressibility and flow on color Doppler imaging. Profunda Femoral Vein: No evidence of thrombus. Normal compressibility and flow on color Doppler imaging. Femoral Vein: No evidence of thrombus. Normal compressibility, respiratory phasicity and response to augmentation. Popliteal Vein: No evidence of thrombus. Normal compressibility, respiratory phasicity and response to augmentation. Calf Veins: Not well seen. Superficial Great Saphenous Vein: No evidence of thrombus. Normal compressibility and flow on color Doppler imaging. Venous Reflux:  None. Other Findings:  None. IMPRESSION: No evidence of deep venous thrombosis. Electronically Signed   By: Ellery Plunk M.D.   On: 02/02/2016 06:19   Dg Hip Unilat With Pelvis 2-3 Views Right  Result Date: 02/02/2016 CLINICAL DATA:  Severe RIGHT leg pain, poor historian. EXAM: DG HIP (WITH OR WITHOUT PELVIS) 2-3V RIGHT COMPARISON:  None. FINDINGS: There is no evidence of hip fracture or dislocation. Status post RIGHT hemiarthroplasty, intact well-seated hardware without periprosthetic lucency. Osteopenia without destructive bony lesions. Moderate vascular calcifications. There is no evidence of arthropathy or other focal bone abnormality. IMPRESSION: No acute fracture deformity or dislocation. Status post RIGHT hip hemiarthroplasty without radiographic findings of hardware failure. Electronically Signed   By: Awilda Metro M.D.   On: 02/02/2016 06:30  Procedures      INITIAL IMPRESSION / ASSESSMENT AND PLAN / ED COURSE  Pertinent labs & imaging results that were available during my care of the patient were reviewed by me and considered in my medical decision  making (see chart for details).  No clear etiology for the patient's bilateral external pain identified is resting comfortably at this time without any discomfort. Spoke with the patient's daughter, who agreed with no further testing at this time.   Clinical Course     ____________________________________________  FINAL CLINICAL IMPRESSION(S) / ED DIAGNOSES  Final diagnoses:  Pain in both lower extremities     MEDICATIONS GIVEN DURING THIS VISIT:  Medications  naloxone (NARCAN) injection 1 mg (not administered)  ondansetron (ZOFRAN) injection 4 mg (4 mg Intravenous Given 02/02/16 0526)  morphine 2 MG/ML injection 2 mg (2 mg Intravenous Given 02/02/16 0526)  morphine 2 MG/ML injection 2 mg (2 mg Intravenous Given 02/02/16 0550)     NEW OUTPATIENT MEDICATIONS STARTED DURING THIS VISIT:  New Prescriptions   No medications on file    Modified Medications   No medications on file    Discontinued Medications   DEXTROAMPHETAMINE (DEXEDRINE SPANSULE) 10 MG 24 HR CAPSULE    Take by mouth.     Note:  This document was prepared using Dragon voice recognition software and may include unintentional dictation errors.    Darci Current, MD 02/02/16 [phone]

## 2016-02-29 ENCOUNTER — Telehealth: Payer: Self-pay | Admitting: Cardiovascular Disease

## 2016-02-29 NOTE — Telephone Encounter (Signed)
Received Home Health Certification and Plan of Care  Certification Period: 01/30/16-03/29/16  Signed by Dr. Allyson SabalBerry and mailed back to [facility] Address: [address]; [place], [place]; [id number]

## 2016-03-06 ENCOUNTER — Inpatient Hospital Stay
Admission: EM | Admit: 2016-03-06 | Discharge: 2016-03-09 | DRG: 202 | Disposition: A | Discharge status: Skilled Nursing Facility | Payer: Medicare Other | Attending: Internal Medicine | Admitting: Internal Medicine

## 2016-03-06 ENCOUNTER — Emergency Department: Payer: Medicare Other

## 2016-03-06 DIAGNOSIS — I10 Essential (primary) hypertension: Secondary | ICD-10-CM | POA: Diagnosis present

## 2016-03-06 DIAGNOSIS — J189 Pneumonia, unspecified organism: Secondary | ICD-10-CM

## 2016-03-06 DIAGNOSIS — Z803 Family history of malignant neoplasm of breast: Secondary | ICD-10-CM

## 2016-03-06 DIAGNOSIS — Z7982 Long term (current) use of aspirin: Secondary | ICD-10-CM

## 2016-03-06 DIAGNOSIS — J209 Acute bronchitis, unspecified: Principal | ICD-10-CM | POA: Diagnosis present

## 2016-03-06 DIAGNOSIS — I350 Nonrheumatic aortic (valve) stenosis: Secondary | ICD-10-CM | POA: Diagnosis present

## 2016-03-06 DIAGNOSIS — J9690 Respiratory failure, unspecified, unspecified whether with hypoxia or hypercapnia: Secondary | ICD-10-CM | POA: Diagnosis present

## 2016-03-06 DIAGNOSIS — R0602 Shortness of breath: Secondary | ICD-10-CM | POA: Diagnosis present

## 2016-03-06 DIAGNOSIS — F0390 Unspecified dementia without behavioral disturbance: Secondary | ICD-10-CM | POA: Diagnosis present

## 2016-03-06 DIAGNOSIS — G47419 Narcolepsy without cataplexy: Secondary | ICD-10-CM | POA: Diagnosis present

## 2016-03-06 DIAGNOSIS — R262 Difficulty in walking, not elsewhere classified: Secondary | ICD-10-CM

## 2016-03-06 DIAGNOSIS — I509 Heart failure, unspecified: Secondary | ICD-10-CM

## 2016-03-06 DIAGNOSIS — Z79899 Other long term (current) drug therapy: Secondary | ICD-10-CM

## 2016-03-06 DIAGNOSIS — J9601 Acute respiratory failure with hypoxia: Secondary | ICD-10-CM | POA: Diagnosis present

## 2016-03-06 DIAGNOSIS — M6281 Muscle weakness (generalized): Secondary | ICD-10-CM

## 2016-03-06 DIAGNOSIS — I4891 Unspecified atrial fibrillation: Secondary | ICD-10-CM | POA: Diagnosis present

## 2016-03-06 HISTORY — DX: Unspecified dementia, unspecified severity, without behavioral disturbance, psychotic disturbance, mood disturbance, and anxiety: F03.90

## 2016-03-06 HISTORY — DX: Unspecified atrial fibrillation: I48.91

## 2016-03-06 LAB — CBC WITH DIFFERENTIAL/PLATELET
Basophils Absolute: 0 10*3/uL (ref 0–0.1)
Basophils Relative: 1 %
EOS PCT: 2 %
Eosinophils Absolute: 0.1 10*3/uL (ref 0–0.7)
HEMATOCRIT: 42.1 % (ref 35.0–47.0)
Hemoglobin: 13.7 g/dL (ref 12.0–16.0)
Lymphocytes Relative: 31 %
Lymphs Abs: 2.8 10*3/uL (ref 1.0–3.6)
MCH: 31.2 pg (ref 26.0–34.0)
MCHC: 32.5 g/dL (ref 32.0–36.0)
MCV: 96.1 fL (ref 80.0–100.0)
MONO ABS: 0.8 10*3/uL (ref 0.2–0.9)
MONOS PCT: 9 %
NEUTROS ABS: 5.2 10*3/uL (ref 1.4–6.5)
Neutrophils Relative %: 57 %
PLATELETS: 348 10*3/uL (ref 150–440)
RBC: 4.38 MIL/uL (ref 3.80–5.20)
RDW: 14.6 % — AB (ref 11.5–14.5)
WBC: 9 10*3/uL (ref 3.6–11.0)

## 2016-03-06 LAB — COMPREHENSIVE METABOLIC PANEL
ALT: 15 U/L (ref 14–54)
ANION GAP: 6 (ref 5–15)
AST: 18 U/L (ref 15–41)
Albumin: 3.5 g/dL (ref 3.5–5.0)
Alkaline Phosphatase: 49 U/L (ref 38–126)
BILIRUBIN TOTAL: 0.6 mg/dL (ref 0.3–1.2)
BUN: 13 mg/dL (ref 6–20)
CO2: 30 mmol/L (ref 22–32)
Calcium: 9.8 mg/dL (ref 8.9–10.3)
Chloride: 106 mmol/L (ref 101–111)
Creatinine, Ser: 0.94 mg/dL (ref 0.44–1.00)
GFR, EST NON AFRICAN AMERICAN: 53 mL/min — AB (ref >60)
Glucose, Bld: 100 mg/dL — ABNORMAL HIGH (ref 65–99)
POTASSIUM: 3.9 mmol/L (ref 3.5–5.1)
Sodium: 142 mmol/L (ref 135–145)
TOTAL PROTEIN: 6.8 g/dL (ref 6.5–8.1)

## 2016-03-06 LAB — TROPONIN I: Troponin I: <0.03 ng/mL (ref <0.03)

## 2016-03-06 LAB — INFLUENZA PANEL BY PCR (TYPE A & B)
INFLAPCR: NEGATIVE
INFLBPCR: NEGATIVE

## 2016-03-06 LAB — BRAIN NATRIURETIC PEPTIDE: B Natriuretic Peptide: 229 pg/mL — ABNORMAL HIGH (ref 0.0–100.0)

## 2016-03-06 LAB — TSH: TSH: 1.13 u[IU]/mL (ref 0.350–4.500)

## 2016-03-06 LAB — MRSA PCR SCREENING: MRSA by PCR: NEGATIVE

## 2016-03-06 MED ORDER — IPRATROPIUM-ALBUTEROL 0.5-2.5 (3) MG/3ML IN SOLN
3.0000 mL | Freq: Once | RESPIRATORY_TRACT | Status: AC
  Administered 2016-03-06: 3 mL via RESPIRATORY_TRACT
  Filled 2016-03-06: qty 3

## 2016-03-06 MED ORDER — ALBUTEROL SULFATE HFA 108 (90 BASE) MCG/ACT IN AERS: 2.0000 | INHALATION_SPRAY | RESPIRATORY_TRACT | Status: DC | PRN

## 2016-03-06 MED ORDER — ONDANSETRON HCL 4 MG/2ML IJ SOLN
4.0000 mg | Freq: Four times a day (QID) | INTRAMUSCULAR | Status: DC | PRN
  Administered 2016-03-07: 4 mg via INTRAVENOUS
  Filled 2016-03-06: qty 2

## 2016-03-06 MED ORDER — MUPIROCIN 2 % EX OINT
1.0000 "application " | TOPICAL_OINTMENT | Freq: Two times a day (BID) | CUTANEOUS | Status: DC
  Filled 2016-03-06: qty 22

## 2016-03-06 MED ORDER — ACETAMINOPHEN 650 MG RE SUPP: 650.0000 mg | Freq: Four times a day (QID) | RECTAL | Status: DC | PRN

## 2016-03-06 MED ORDER — HYDROCODONE-ACETAMINOPHEN 5-325 MG PO TABS
1.0000 | ORAL_TABLET | ORAL | Status: DC | PRN
  Administered 2016-03-07: 10:00:00 1 via ORAL
  Filled 2016-03-06: qty 1

## 2016-03-06 MED ORDER — PIPERACILLIN-TAZOBACTAM 3.375 G IVPB
3.3750 g | Freq: Three times a day (TID) | INTRAVENOUS | Status: DC
Start: 2016-03-06 — End: 2016-03-07
  Administered 2016-03-06 – 2016-03-07 (×3): 3.375 g via INTRAVENOUS
  Filled 2016-03-06 (×3): qty 50

## 2016-03-06 MED ORDER — SENNOSIDES-DOCUSATE SODIUM 8.6-50 MG PO TABS
1.0000 | ORAL_TABLET | Freq: Every evening | ORAL | Status: DC | PRN
Start: 2016-03-06 — End: 2016-03-09

## 2016-03-06 MED ORDER — ASPIRIN EC 81 MG PO TBEC
81.0000 mg | DELAYED_RELEASE_TABLET | Freq: Every day | ORAL | Status: DC
  Administered 2016-03-06 – 2016-03-08 (×3): 81 mg via ORAL
  Filled 2016-03-06 (×3): qty 1

## 2016-03-06 MED ORDER — ONDANSETRON HCL 4 MG PO TABS: 4.0000 mg | ORAL_TABLET | Freq: Four times a day (QID) | ORAL | Status: DC | PRN

## 2016-03-06 MED ORDER — ENOXAPARIN SODIUM 40 MG/0.4ML ~~LOC~~ SOLN
40.0000 mg | SUBCUTANEOUS | Status: DC
Start: 2016-03-06 — End: 2016-03-09
  Administered 2016-03-06 – 2016-03-08 (×3): 40 mg via SUBCUTANEOUS
  Filled 2016-03-06 (×3): qty 0.4

## 2016-03-06 MED ORDER — DEXTROAMPHETAMINE SULFATE ER 5 MG PO CP24
10.0000 mg | ORAL_CAPSULE | Freq: Every day | ORAL | Status: DC
  Administered 2016-03-06 – 2016-03-08 (×3): 10 mg via ORAL
  Filled 2016-03-06 (×3): qty 2

## 2016-03-06 MED ORDER — ACETAMINOPHEN 325 MG PO TABS
650.0000 mg | ORAL_TABLET | Freq: Four times a day (QID) | ORAL | Status: DC | PRN
  Administered 2016-03-08: 09:00:00 650 mg via ORAL
  Filled 2016-03-06 (×2): qty 2

## 2016-03-06 MED ORDER — ALBUTEROL SULFATE (2.5 MG/3ML) 0.083% IN NEBU: 2.5000 mg | INHALATION_SOLUTION | RESPIRATORY_TRACT | Status: DC | PRN

## 2016-03-06 MED ORDER — SODIUM CHLORIDE 0.9% FLUSH
3.0000 mL | Freq: Two times a day (BID) | INTRAVENOUS | Status: DC
  Administered 2016-03-06 – 2016-03-08 (×6): 3 mL via INTRAVENOUS

## 2016-03-06 MED ORDER — FUROSEMIDE 10 MG/ML IJ SOLN
40.0000 mg | Freq: Once | INTRAMUSCULAR | Status: AC
  Administered 2016-03-06: 40 mg via INTRAVENOUS
  Filled 2016-03-06: qty 4

## 2016-03-06 MED ORDER — VANCOMYCIN HCL IN DEXTROSE 1-5 GM/200ML-% IV SOLN
1000.0000 mg | INTRAVENOUS | Status: DC
  Administered 2016-03-06: 1000 mg via INTRAVENOUS
  Filled 2016-03-06: qty 200

## 2016-03-06 MED ORDER — VANCOMYCIN HCL IN DEXTROSE 1-5 GM/200ML-% IV SOLN
1000.0000 mg | Freq: Once | INTRAVENOUS | Status: AC
  Administered 2016-03-06: 1000 mg via INTRAVENOUS
  Filled 2016-03-06: qty 200

## 2016-03-06 MED ORDER — CHLORHEXIDINE GLUCONATE CLOTH 2 % EX PADS
6.0000 | MEDICATED_PAD | Freq: Every day | CUTANEOUS | Status: DC
  Filled 2016-03-06: qty 6

## 2016-03-06 MED ORDER — PIPERACILLIN-TAZOBACTAM 3.375 G IVPB 30 MIN: 3.3750 g | Freq: Once | INTRAVENOUS | Status: DC

## 2016-03-06 NOTE — ED Provider Notes (Signed)
[facility] Emergency Department Provider Note  ____________________________________________   First MD Initiated Contact with Patient 03/06/16 [phone]     (approximate)  I have reviewed the triage vital signs and the nursing notes.   HISTORY  Chief Complaint Cough  Level 5 caveat:  history/ROS limited by dementia and respiratory distress  HPI Madilynne Mullan is a 83 y.o. female with a medical history as listed below presents by EMS from her nursing facility for cough and difficulty breathing.  She was seen by her primary care doctorabout three days ago and started on prednisone for some mild expiratory wheezing thought to be secondary to a viral upper respiratory infection.  No additional history is available from the nursing home via EMS but reportedly she is having a much worsening productive cough with copious clear and frothy sputum and increased work of breathing.  Review of systems is limited and the patient will only answer simple questions and not answer review of systems questions.   Past Medical History:  Diagnosis Date  . Atrial fibrillation (HCC)    seen on multiple prior EKGs  . Dementia   . Hypertension   . Narcolepsy     Patient Active Problem List   Diagnosis Date Noted  . Syncopal episodes 01/04/2016  . Syncope 01/04/2016    History reviewed. No pertinent surgical history.  Prior to Admission medications   Medication Sig Start Date End Date Taking? Authorizing Provider  acetaminophen (TYLENOL) 500 MG tablet Take 500 mg by mouth daily.   Yes Historical Provider, MD  albuterol (PROVENTIL HFA;VENTOLIN HFA) 108 (90 Base) MCG/ACT inhaler Inhale 2 puffs into the lungs every 4 (four) hours as needed for shortness of breath.  07/15/14  Yes Historical Provider, MD  aspirin EC 81 MG tablet Take by mouth daily.    Yes Historical Provider, MD  dextroamphetamine (DEXEDRINE SPANSULE) 10 MG 24 hr capsule Take 10 mg by mouth daily.   Yes Historical  Provider, MD  fluticasone (FLONASE) 50 MCG/ACT nasal spray Place 2 sprays into both nostrils daily. 03/03/16 03/03/17 Yes Historical Provider, MD  guaiFENesin (MUCINEX) 600 MG 12 hr tablet Take 600 mg by mouth every 12 (twelve) hours as needed for cough or congestion.   Yes Historical Provider, MD  nystatin (MYCOSTATIN/NYSTOP) powder Apply topically 2 (two) times daily. Apply under breast twice daily for redness. 11/24/15 11/23/16 Yes Historical Provider, MD  predniSONE (DELTASONE) 10 MG tablet Take 10 mg by mouth daily. Take 10 mg once daily for 10 days for upper respiratory infection. 03/05/16 03/14/16 Yes Historical Provider, MD    Allergies Patient has no known allergies.  Family History  Problem Relation Age of Onset  . Breast cancer Paternal Grandmother     Social History Social History  Substance Use Topics  . Smoking status: Never Smoker  . Smokeless tobacco: Never Used  . Alcohol use No    Review of Systems  Level 5 caveat:  history/ROS limited by acute/critical illness ____________________________________________   PHYSICAL EXAM:  ED Triage Vitals  Enc Vitals Group     BP 03/06/16 0549 (!) 173/125     Pulse --      Resp --      Temp 03/06/16 0547 98.3 F (36.8 C)     Temp Source 03/06/16 0547 Oral     SpO2 03/06/16 0549 97 %     Weight 03/06/16 0549 204 lb 8 oz (92.8 kg)     Height 03/06/16 0549 5\' 4"  (1.626 m)  Head Circumference --      Peak Flow --      Pain Score --      Pain Loc --      Pain Edu? --      Excl. in GC? --      Constitutional: Appears acute on chronically ill with increased work of breathing and audible wheezing Eyes: Conjunctivae are normal. PERRL. EOMI. Head: Atraumatic. Nose: No congestion/rhinnorhea. Mouth/Throat: Mucous membranes are moist.   Neck: No stridor.  No meningeal signs.   Cardiovascular: Normal rate, regular rhythm. Good peripheral circulation. Grossly normal heart sounds. Respiratory: increased respiratory effort,  audible wheezing, frequent productive cough (copious clear sputum).   Gastrointestinal: Soft and nontender. No distention.  Musculoskeletal: No lower extremity tenderness nor edema. No gross deformities of extremities. Neurologic:  Normal speech and language. No gross focal neurologic deficits are appreciated.  Skin:  Skin is warm, dry and intact. No rash noted.   ____________________________________________   LABS (all labs ordered are listed, but only abnormal results are displayed)  Labs Reviewed  CBC WITH DIFFERENTIAL/PLATELET - Abnormal; Notable for the following:       Result Value   RDW 14.6 (*)    All other components within normal limits  BRAIN NATRIURETIC PEPTIDE - Abnormal; Notable for the following:    B Natriuretic Peptide 229.0 (*)    All other components within normal limits  COMPREHENSIVE METABOLIC PANEL - Abnormal; Notable for the following:    Glucose, Bld 100 (*)    GFR calc non Af Amer 53 (*)    All other components within normal limits  TROPONIN I  INFLUENZA PANEL BY PCR (TYPE A & B)   ____________________________________________  EKG  ED ECG REPORT I, Louise Rawson, the attending physician, personally viewed and interpreted this ECG.  Date: 03/06/2016 EKG Time: 5:54 AM Rate: 87 Rhythm: Atrial fibrillation with rare PVC QRS Axis: normal Intervals: normal ST/T Wave abnormalities: Non-specific ST segment / T-wave changes, but no evidence of acute ischemia. Conduction Disturbances: none Narrative Interpretation: unremarkable. last several EKGs on record also demonstrate atrial fibrillation  ____________________________________________  RADIOLOGY   Dg Chest Portable 1 View  Result Date: 03/06/2016 CLINICAL DATA:  Shortness of breath and productive cough. Seen last week by primary care physician but cough is worsening. EXAM: PORTABLE CHEST 1 VIEW COMPARISON:  11/30/2010 FINDINGS: Shallow inspiration with elevation of the right hemidiaphragm, similar  to prior study. Borderline heart size with normal pulmonary vascularity. Calcification of the mitral valve annulus. Calcified and tortuous aorta. No focal airspace disease or consolidation in the lungs. No pneumothorax. Surgical clips in the base of the neck. Degenerative changes in the spine and shoulders. IMPRESSION: Shallow inspiration with elevation of the right hemidiaphragm. No evidence of active pulmonary disease. Electronically Signed   By: Burman NievesWilliam  Stevens M.D.   On: 03/06/2016 06:40    ____________________________________________   PROCEDURES  Procedure(s) performed:   Procedures   Critical Care performed: No ____________________________________________   INITIAL IMPRESSION / ASSESSMENT AND PLAN / ED COURSE  Pertinent labs & imaging results that were available during my care of the patient were reviewed by me and considered in my medical decision making (see chart for details).  The patient's initial presentation appears most consistent with CHF exacerbation.  She is hypertensive and has clear frothy sputum and increased work of breathing with audible wheezing.  I will attain a standard workup but [facility].  We will also check  an influenza swab given the probability she will require admission.  DuoNeb for comfort to see if this helps her work of breathing.  I will hold on diuretic until we see a chest x-ray and/or BNP.  She has no significant lower extremity edema but she may still have pulmonary edema.   Clinical Course as of Mar 06 729  Mon Mar 06, 2016  0720 The patient appears like early CHF exacerbation as described above in spite of not having any obvious pulmonary edema (yet) on CXR.  I will provide a dose of Lasix 40 mg IV given the copious secretions.  Discussed by phone with Dr. Juliene Pina who will admit.  [CF]  0730 Influenza pending at the time of admission.  [CF]    Clinical Course User Index [CF] Loleta Rose, MD     ____________________________________________  FINAL CLINICAL IMPRESSION(S) / ED DIAGNOSES  Final diagnoses:  Acute respiratory failure with hypoxemia (HCC)  Congestive heart failure, unspecified congestive heart failure chronicity, unspecified congestive heart failure type (HCC)     MEDICATIONS GIVEN DURING THIS VISIT:  Medications  furosemide (LASIX) injection 40 mg (not administered)  ipratropium-albuterol (DUONEB) 0.5-2.5 (3) MG/3ML nebulizer solution 3 mL (3 mLs Nebulization Given 03/06/16 0648)     NEW OUTPATIENT MEDICATIONS STARTED DURING THIS VISIT:  New Prescriptions   No medications on file    Modified Medications   No medications on file    Discontinued Medications   No medications on file     Note:  This document was prepared using Dragon voice recognition software and may include unintentional dictation errors.    Loleta Rose, MD 03/06/16 [phone]

## 2016-03-06 NOTE — ED Triage Notes (Signed)
Pt from [facility].  Per EMS, pt was diagnosed with cough last week by PCP.  Pt anxious and feeling like cough is worse.  Pt having clear/white sputum coughed up at this time.  Pt tearful and crying off and on.  Per EMS, pt's RA o2 sat was 92-95%.  Pt placed on [facility]LNC for comfort.

## 2016-03-06 NOTE — NC FL2 (Signed)
[place] MEDICAID FL2 LEVEL OF CARE SCREENING TOOL     IDENTIFICATION  Patient Name: Joy DaviesCarrie Hill Birthdate: 03/31/1925 Sex: female Admission Date (Current Location): 03/06/2016  [place]   Facility and Address:  [facility], [address], [place], [place] [address]      Provider Number: [id number]  Attending Physician Name and Address:  Adrian SaranSital Mody, MD  Relative Name and Phone Number:       Current Level of Care: Hospital Recommended Level of Care: SNF  Prior Approval Number:    Date Approved/Denied:   PASRR Number:  [id number] )  Discharge Plan: SNF     Current Diagnoses: Patient Active Problem List   Diagnosis Date Noted  . Respiratory failure (HCC) 03/06/2016  . Syncopal episodes 01/04/2016  . Syncope 01/04/2016    Orientation RESPIRATION BLADDER Height & Weight     Self  O2 (2 Liters Oxygen ) Incontinent Weight: 194 lb 11.2 oz (88.3 kg) Height:  5\' 2"  (157.5 cm)  BEHAVIORAL SYMPTOMS/MOOD NEUROLOGICAL BOWEL NUTRITION STATUS   (none)  (none) Continent Diet (Diet: Soft )  AMBULATORY STATUS COMMUNICATION OF NEEDS Skin   Extensive Assist Verbally Normal                       Personal Care Assistance Level of Assistance  Bathing, Feeding, Dressing Bathing Assistance: Limited assistance Feeding assistance: Limited assistance Dressing Assistance: Limited assistance     Functional Limitations Info  Sight, Hearing, Speech Sight Info: Adequate Hearing Info: Adequate Speech Info: Adequate    SPECIAL CARE FACTORS FREQUENCY  PT (By licensed PT)     PT Frequency:  (2-3 days per week via [facility] )              Contractures      Additional Factors Info  Code Status, Allergies Code Status Info:  (Full Code. ) Allergies Info:  (No Known Allergies. )           Current Medications (03/06/2016):  This is the current hospital active medication list Current  Facility-Administered Medications  Medication Dose Route Frequency Provider Last Rate Last Dose  . acetaminophen (TYLENOL) tablet 650 mg  650 mg Oral Q6H PRN Adrian SaranSital Mody, MD       Or  . acetaminophen (TYLENOL) suppository 650 mg  650 mg Rectal Q6H PRN Sital Mody, MD      . albuterol (PROVENTIL) (2.5 MG/3ML) 0.083% nebulizer solution 2.5 mg  2.5 mg Nebulization Q4H PRN Adrian SaranSital Mody, MD      . aspirin EC tablet 81 mg  81 mg Oral Daily Sital Mody, MD   81 mg at 03/06/16 1058  . dextroamphetamine (DEXEDRINE SPANSULE) 24 hr capsule 10 mg  10 mg Oral Daily Adrian SaranSital Mody, MD   10 mg at 03/06/16 1251  . enoxaparin (LOVENOX) injection 40 mg  40 mg Subcutaneous Q24H Sital Mody, MD      . HYDROcodone-acetaminophen (NORCO/VICODIN) 5-325 MG per tablet 1-2 tablet  1-2 tablet Oral Q4H PRN Adrian SaranSital Mody, MD      . ondansetron (ZOFRAN) tablet 4 mg  4 mg Oral Q6H PRN Adrian SaranSital Mody, MD       Or  . ondansetron (ZOFRAN) injection 4 mg  4 mg Intravenous Q6H PRN Sital Mody, MD      . piperacillin-tazobactam (ZOSYN) IVPB 3.375 g  3.375 g Intravenous Q8H Sital Mody, MD   3.375 g at 03/06/16 1058  . senna-docusate (Senokot-S) tablet  1 tablet  1 tablet Oral QHS PRN Adrian Saran, MD      . sodium chloride flush (NS) 0.9 % injection 3 mL  3 mL Intravenous Q12H Sital Mody, MD   3 mL at 03/06/16 1053  . vancomycin (VANCOCIN) IVPB 1000 mg/200 mL premix  1,000 mg Intravenous Q24H Adrian Saran, MD         Discharge Medications: Please see discharge summary for a list of discharge medications.  Relevant Imaging Results:  Relevant Lab Results:   Additional Information  (SSN: [id number])  Sample, Darleen Crocker, LCSW

## 2016-03-06 NOTE — Progress Notes (Signed)
Pt's MRSA PCR negative.  Made Dr. Juliene PinaMody aware and verbal order to d/c isolation.  Orson Apeanielle Vicci Reder, RN

## 2016-03-06 NOTE — ED Notes (Signed)
Pt placed on droplet precautions

## 2016-03-06 NOTE — ED Notes (Signed)
Report to ashley, rn

## 2016-03-06 NOTE — H&P (Signed)
[facility] [place] [facility]   PATIENT NAME: Joy DaviesCarrie Hill    MR#:  [id number]  DATE OF BIRTH:  03/20/1925  DATE OF ADMISSION:  03/06/2016  PRIMARY CARE PHYSICIAN: No PCP Per Patient   REQUESTING/REFERRING PHYSICIAN: dr York Ceriseforbach  CHIEF COMPLAINT:    Cough and shortness of breath HISTORY OF PRESENT ILLNESS:  Joy DaviesCarrie Costley  is a 83 y.o. female with a known history of Dementia who resents from assisted living due to cough and shortness of breath. Daughter reports last week she saw her primary care physician and was prescribed prednisone and a cough suppressant. Over the past few days she has had increasing cough and shortness of breath. Chest x-ray in emergency room did not show evidence of pneumonia or CHF. She was given 1 dose of Lasix. Influenza testing was negative.  PAST MEDICAL HISTORY:   Past Medical History:  Diagnosis Date  . Atrial fibrillation (HCC)    seen on multiple prior EKGs  . Dementia   . Hypertension   . Narcolepsy     PAST SURGICAL HISTORY:   none SOCIAL HISTORY:   Social History  Substance Use Topics  . Smoking status: Never Smoker  . Smokeless tobacco: Never Used  . Alcohol use No    FAMILY HISTORY:   Family History  Problem Relation Age of Onset  . Breast cancer Paternal Grandmother     DRUG ALLERGIES:  No Known Allergies  REVIEW OF SYSTEMS:   Review of Systems  Unable to perform ROS: Dementia    MEDICATIONS AT HOME:   Prior to Admission medications   Medication Sig Start Date End Date Taking? Authorizing Provider  acetaminophen (TYLENOL) 500 MG tablet Take 500 mg by mouth daily.   Yes Historical Provider, MD  albuterol (PROVENTIL HFA;VENTOLIN HFA) 108 (90 Base) MCG/ACT inhaler Inhale 2 puffs into the lungs every 4 (four) hours as needed for shortness of breath.  07/15/14  Yes Historical Provider, MD  aspirin EC 81 MG tablet Take by mouth daily.    Yes Historical Provider, MD  dextroamphetamine (DEXEDRINE  SPANSULE) 10 MG 24 hr capsule Take 10 mg by mouth daily.   Yes Historical Provider, MD  fluticasone (FLONASE) 50 MCG/ACT nasal spray Place 2 sprays into both nostrils daily. 03/03/16 03/03/17 Yes Historical Provider, MD  guaiFENesin (MUCINEX) 600 MG 12 hr tablet Take 600 mg by mouth every 12 (twelve) hours as needed for cough or congestion.   Yes Historical Provider, MD  nystatin (MYCOSTATIN/NYSTOP) powder Apply topically 2 (two) times daily. Apply under breast twice daily for redness. 11/24/15 11/23/16 Yes Historical Provider, MD  predniSONE (DELTASONE) 10 MG tablet Take 10 mg by mouth daily. Take 10 mg once daily for 10 days for upper respiratory infection. 03/05/16 03/14/16 Yes Historical Provider, MD      VITAL SIGNS:  Blood pressure 109/70, pulse 83, temperature 98.3 F (36.8 C), temperature source Oral, resp. rate (!) 27, height 5\' 4"  (1.626 m), weight 92.8 kg (204 lb 8 oz), SpO2 95 %.  PHYSICAL EXAMINATION:   Physical Exam  Constitutional: She is well-developed, well-nourished, and in no distress. No distress.  HENT:  Head: Normocephalic.  Eyes: No scleral icterus.  Neck: Normal range of motion. Neck supple. No JVD present. No tracheal deviation present.  Cardiovascular: Normal rate, regular rhythm and normal heart sounds.  Exam reveals no gallop and no friction rub.   No murmur heard. Pulmonary/Chest: Effort normal. No respiratory distress. She has no wheezes. She has no rales.  She exhibits no tenderness.  Coarse breath sounds bilaterally  Abdominal: Soft. Bowel sounds are normal. She exhibits no distension and no mass. There is no tenderness. There is no rebound and no guarding.  Musculoskeletal: Normal range of motion. She exhibits no edema.  Neurological: She is alert.  Skin: Skin is warm. No rash noted. No erythema.      LABORATORY PANEL:   CBC  Recent Labs Lab 03/06/16 0558  WBC 9.0  HGB 13.7  HCT 42.1  PLT 348    ------------------------------------------------------------------------------------------------------------------  Chemistries   Recent Labs Lab 03/06/16 0558  NA 142  K 3.9  CL 106  CO2 30  GLUCOSE 100*  BUN 13  CREATININE 0.94  CALCIUM 9.8  AST 18  ALT 15  ALKPHOS 49  BILITOT 0.6   ------------------------------------------------------------------------------------------------------------------  Cardiac Enzymes  Recent Labs Lab 03/06/16 0558  TROPONINI <0.03   ------------------------------------------------------------------------------------------------------------------  RADIOLOGY:  Dg Chest Portable 1 View  Result Date: 03/06/2016 CLINICAL DATA:  Shortness of breath and productive cough. Seen last week by primary care physician but cough is worsening. EXAM: PORTABLE CHEST 1 VIEW COMPARISON:  11/30/2010 FINDINGS: Shallow inspiration with elevation of the right hemidiaphragm, similar to prior study. Borderline heart size with normal pulmonary vascularity. Calcification of the mitral valve annulus. Calcified and tortuous aorta. No focal airspace disease or consolidation in the lungs. No pneumothorax. Surgical clips in the base of the neck. Degenerative changes in the spine and shoulders. IMPRESSION: Shallow inspiration with elevation of the right hemidiaphragm. No evidence of active pulmonary disease. Electronically Signed   By: Burman Nieves M.D.   On: 03/06/2016 06:40    EKG:   Atrial fibrillation heart rate 87  IMPRESSION AND PLAN:    83 year old female with history of atrial fibrillation and dementia who presents with cough and acute hypoxic respiratory failure.  1. Acute hypoxic respiratory failure in the setting of probable HCAP was low BNP and negative influenza testing Start empiric antibiotics with Zosyn and vancomycin Check MRSa PCR and discontinue vancomycin if negative Chest x-ray for a.m. Wean oxygen as tolerated Echocardiogram to value  cardiac function   2. Narcolepsy: Continued Dexedrine   3. History of atrial fibrillation: Heart rate is controlled.  4. Dementia: Patient is at baseline    All the records are reviewed and case discussed with ED provider. Management plans discussed with the patient's daughter and she in agreement  CODE STATUS: FULL  TOTAL TIME TAKING CARE OF THIS PATIENT: 45 minutes.    Hadassa Cermak M.D on 03/06/2016 at 9:35 AM  Between 7am to 6pm - Pager - [phone]  After 6pm go to [email or web address] - Social research officer, government  [facility]  Office  [phone]  CC: Primary care physician; No PCP Per Patient

## 2016-03-06 NOTE — Progress Notes (Signed)
ANTIBIOTIC CONSULT NOTE - INITIAL  Pharmacy Consult for Vancomycin and Zosyn Indication: acute respiratory failure  No Known Allergies  Patient Measurements: Height: 5\' 4"  (162.6 cm) Weight: 204 lb 8 oz (92.8 kg) IBW/kg (Calculated) : 54.7 Adjusted Body Weight:   Vital Signs: Temp: 98.3 F (36.8 C) (02/05 0547) Temp Source: Oral (02/05 0547) BP: 109/70 (02/05 0800) Pulse Rate: 83 (02/05 0800) Intake/Output from previous day: No intake/output data recorded. Intake/Output from this shift: No intake/output data recorded.  Labs:  Recent Labs  03/06/16 0558  WBC 9.0  HGB 13.7  PLT 348  CREATININE 0.94   Estimated Creatinine Clearance: 45.6 mL/min (by C-G formula based on SCr of 0.94 mg/dL). No results for input(s): VANCOTROUGH, VANCOPEAK, VANCORANDOM, GENTTROUGH, GENTPEAK, GENTRANDOM, TOBRATROUGH, TOBRAPEAK, TOBRARND, AMIKACINPEAK, AMIKACINTROU, AMIKACIN in the last 72 hours.   Microbiology: No results found for this or any previous visit (from the past 720 hour(s)).  Medical History: Past Medical History:  Diagnosis Date  . Atrial fibrillation (HCC)    seen on multiple prior EKGs  . Dementia   . Hypertension   . Narcolepsy     Medications:  Prescriptions Prior to Admission  Medication Sig Dispense Refill Last Dose  . acetaminophen (TYLENOL) 500 MG tablet Take 500 mg by mouth daily.   03/05/2016 at 0800  . albuterol (PROVENTIL HFA;VENTOLIN HFA) 108 (90 Base) MCG/ACT inhaler Inhale 2 puffs into the lungs every 4 (four) hours as needed for shortness of breath.    PRN at PRN  . aspirin EC 81 MG tablet Take by mouth daily.    03/05/2016 at 0800  . dextroamphetamine (DEXEDRINE SPANSULE) 10 MG 24 hr capsule Take 10 mg by mouth daily.   03/05/2016 at 0800  . fluticasone (FLONASE) 50 MCG/ACT nasal spray Place 2 sprays into both nostrils daily.   03/05/2016 at 0800  . guaiFENesin (MUCINEX) 600 MG 12 hr tablet Take 600 mg by mouth every 12 (twelve) hours as needed for cough or  congestion.   03/05/2016 at 0950  . nystatin (MYCOSTATIN/NYSTOP) powder Apply topically 2 (two) times daily. Apply under breast twice daily for redness.   03/05/2016 at 2000  . predniSONE (DELTASONE) 10 MG tablet Take 10 mg by mouth daily. Take 10 mg once daily for 10 days for upper respiratory infection.   03/05/2016 at 0800   Scheduled:  . Chlorhexidine Gluconate Cloth  6 each Topical Q0600  . enoxaparin (LOVENOX) injection  40 mg Subcutaneous Q24H  . mupirocin ointment  1 application Nasal BID  . piperacillin-tazobactam (ZOSYN)  IV  3.375 g Intravenous Q8H  . sodium chloride flush  3 mL Intravenous Q12H  . vancomycin  1,000 mg Intravenous Once   Assessment: Pharmacy consulted to dose and monitor Vancomycin and Zosyn therapy in this 83 year old female.  Goal of Therapy:  Vancomycin trough level 15-20 mcg/ml  Plan:  Will start Vancomycin 1 g IV q24 hours 10 hours after the initial dose of Vancomycin. Will also start Zosyn 3.375 g IV q8 hours.   Heman Que D 03/06/2016,9:07 AM

## 2016-03-06 NOTE — Progress Notes (Signed)
Family Meeting Note  Advance Directive:no  Today a meeting took place with the daughter.  Patient is unable to participate due [id number]:Lacked capacity dementia  The following clinical team members were present during this meeting:MD RN  The following were discussed:Patient's diagnosis: , Patient's progosis: Unable to determine and Goals for treatment: Full Code  Additional follow-up to be provided: none  Time spent during discussion:16 minutes  Geralynn Capri, MD

## 2016-03-06 NOTE — Clinical Social Work Note (Signed)
Clinical Social Work Assessment  Patient Details  Name: Joy Hill MRN: [id number] Date of Birth: 1925/12/17  Date of referral:  03/06/16               Reason for consult:  Other (Comment Required) (From [facility] )                Permission sought to share information with:  Facility Art therapist granted to share information::  Yes, Verbal Permission Granted  Name::        Agency::     Relationship::     Contact Information:     Housing/Transportation Living arrangements for the past 2 months:  Joy Hill of Information:  Adult Children, Facility Patient Interpreter Needed:  None Criminal Activity/Legal Involvement Pertinent to Current Situation/Hospitalization:  No - Comment as needed Significant Relationships:  Adult Children Lives with:  Facility Resident Do you feel safe going back to the place where you live?  Yes Need for family participation in patient care:  Yes (Comment)  Care giving concerns:  Patient is a long term care resident at [facility] (fax: [phone]).    Social Worker assessment / plan:  Holiday representative ([place]) received consult that patient is from [facility]. CSW met with patient and her daughter Joy Hill [phone] was at bedside. Patient did not participate in assessment. Per daughter patient has been at [facility] since 12/20/2015 on the ALF side. Per daughter patient walks short distances with a walker and uses a wheel chair for long distances. Daughter reported that patient being on oxygen is new and she is on room air at baseline. Per RN patient will likely be able to wean off the oxygen before discharge. Daughter reported that patient is open to [facility] home health PT at [place]. Per daughter patient will likely needs EMS for transport back to ALF. Daughter prefers for patient to return to [place]. CSW contacted Santiago Glad resident Glass blower/designer at [facility]. Santiago Glad confirmed what the daughter  described and stated that patient can return when stable. FL2 complete, CSW will continue to follow and assist as needed.   Employment status:  Retired Nurse, adult PT Recommendations:  Not assessed at this time Information / Referral to community resources:  Other (Comment Required) (Home Health at ALF )  Patient/Family's Response to care:  Patient's daughter prefers for patient to return to [facility].   Patient/Family's Understanding of and Emotional Response to Diagnosis, Current Treatment, and Prognosis:  Daughter was very pleasant and thanked CSW for visit.   Emotional Assessment Appearance:  Appears stated age Attitude/Demeanor/Rapport:    Affect (typically observed):  Accepting, Adaptable, Pleasant Orientation:  Oriented to Self, Fluctuating Orientation (Suspected and/or reported Sundowners) Alcohol / Substance use:  Not Applicable Psych involvement (Current and /or in the community):  No (Comment)  Discharge Needs  Concerns to be addressed:  Discharge Planning Concerns Readmission within the last 30 days:  No Current discharge risk:  Chronically ill, Cognitively Impaired Barriers to Discharge:  Continued Medical Work up   [employer], Veronia Beets, LCSW 03/06/2016, 2:05 PM

## 2016-03-07 ENCOUNTER — Inpatient Hospital Stay: Payer: Medicare Other

## 2016-03-07 LAB — CBC
HCT: 40.4 % (ref 35.0–47.0)
Hemoglobin: 13.9 g/dL (ref 12.0–16.0)
MCH: 32.4 pg (ref 26.0–34.0)
MCHC: 34.3 g/dL (ref 32.0–36.0)
MCV: 94.4 fL (ref 80.0–100.0)
PLATELETS: 336 10*3/uL (ref 150–440)
RBC: 4.28 MIL/uL (ref 3.80–5.20)
RDW: 14.4 % (ref 11.5–14.5)
WBC: 7.4 10*3/uL (ref 3.6–11.0)

## 2016-03-07 LAB — BASIC METABOLIC PANEL
Anion gap: 8 (ref 5–15)
BUN: 15 mg/dL (ref 6–20)
CHLORIDE: 100 mmol/L — AB (ref 101–111)
CO2: 31 mmol/L (ref 22–32)
CREATININE: 0.84 mg/dL (ref 0.44–1.00)
Calcium: 9.5 mg/dL (ref 8.9–10.3)
GFR calc Af Amer: >60 mL/min (ref >60)
GFR calc non Af Amer: >60 mL/min (ref >60)
Glucose, Bld: 95 mg/dL (ref 65–99)
Potassium: 3.7 mmol/L (ref 3.5–5.1)
SODIUM: 139 mmol/L (ref 135–145)

## 2016-03-07 MED ORDER — AZITHROMYCIN 500 MG PO TABS
500.0000 mg | ORAL_TABLET | Freq: Every day | ORAL | Status: DC
  Administered 2016-03-07 – 2016-03-08 (×2): 500 mg via ORAL
  Filled 2016-03-07 (×2): qty 1

## 2016-03-07 NOTE — Progress Notes (Signed)
Pharmacy Antibiotic Note  Joy Hill is a 83 y.o. female admitted on 03/06/2016 with acute respiratory failure.  Pharmacy has been consulted for Zosyn and Vancomycin dosing. Vancomycin discontinued 03/07/16.  Plan: Zosyn 3.375g IV q8h (4 hour infusion).     Height: 5\' 2"  (157.5 cm) Weight: 193 lb 9 oz (87.8 kg) IBW/kg (Calculated) : 50.1  Temp (24hrs), Avg:98 F (36.7 C), Min:97.6 F (36.4 C), Max:98.2 F (36.8 C)   Recent Labs Lab 03/06/16 0558 03/07/16 0537  WBC 9.0 7.4  CREATININE 0.94 0.84    Estimated Creatinine Clearance: 47.6 mL/min (by C-G formula based on SCr of 0.84 mg/dL).    No Known Allergies  Antimicrobials this admission: Vanc 2/5 >> 2/6 Zosyn 2/5 >>    Dose adjustments this admission:    Microbiology results:   BCx:     UCx:      Sputum:    2/5 MRSA PCR: negative  Thank you for allowing pharmacy to be a part of this patient's care.  Miya Luviano A 03/07/2016 7:35 AM

## 2016-03-07 NOTE — Progress Notes (Signed)
[employer] - [place] at [facility]   PATIENT NAME: Joy Hill    MR#:  [id number]  DATE OF BIRTH:  16-Jan-1926  SUBJECTIVE:  Patient with severe dementia Still with cough  Daughter at bedside  REVIEW OF SYSTEMS:    Review of Systems  Unable to perform ROS: Dementia    Tolerating Diet:yes       DRUG ALLERGIES:  No Known Allergies  VITALS:  Blood pressure 119/61, pulse 90, temperature 98.2 F (36.8 C), temperature source Axillary, resp. rate 16, height 5\' 2"  (1.575 m), weight 87.8 kg (193 lb 9 oz), SpO2 95 %.  PHYSICAL EXAMINATION:   Physical Exam  Constitutional: She is well-developed, well-nourished, and in no distress. No distress.  HENT:  Head: Normocephalic.  Eyes: No scleral icterus.  Neck: Normal range of motion. Neck supple. No JVD present. No tracheal deviation present.  Cardiovascular: Normal rate, regular rhythm and normal heart sounds.  Exam reveals no gallop and no friction rub.   No murmur heard. Pulmonary/Chest: Effort normal and breath sounds normal. No respiratory distress. She has no wheezes. She has no rales. She exhibits no tenderness.  Abdominal: Soft. Bowel sounds are normal. She exhibits no distension and no mass. There is no tenderness. There is no rebound and no guarding.  Musculoskeletal: She exhibits no edema.  Neurological:  sleepy  Skin: Skin is warm. No rash noted. No erythema.      LABORATORY PANEL:   CBC  Recent Labs Lab 03/07/16 0537  WBC 7.4  HGB 13.9  HCT 40.4  PLT 336   ------------------------------------------------------------------------------------------------------------------  Chemistries   Recent Labs Lab 03/06/16 0558 03/07/16 0537  NA 142 139  K 3.9 3.7  CL 106 100*  CO2 30 31  GLUCOSE 100* 95  BUN 13 15  CREATININE 0.94 0.84  CALCIUM 9.8 9.5  AST 18  --   ALT 15  --   ALKPHOS 49  --   BILITOT 0.6  --     ------------------------------------------------------------------------------------------------------------------  Cardiac Enzymes  Recent Labs Lab 03/06/16 0558  TROPONINI <0.03   ------------------------------------------------------------------------------------------------------------------  RADIOLOGY:  X-ray Chest Pa And Lateral  Result Date: 03/07/2016 CLINICAL DATA:  Expiratory failure, productive cough, and wheezing for the past 3 days. History of atrial fibrillation, nonsmoker. EXAM: CHEST  2 VIEW COMPARISON:  Portable chest x-ray of March 06, 2016 and chest x-ray of November 30, 2010. FINDINGS: The right hemidiaphragm is chronically elevated. The interstitial markings of both lungs are coarse but similar to that seen previously. There is no alveolar infiltrate or pleural effusion. The cardiac silhouette is enlarged. There is dense calcification in the mitral valvular annulus. There is calcification in the wall of the thoracic aorta. The pulmonary vascularity is not engorged. There surgical clips in the thyroid bed. The bony thorax exhibits no acute abnormality. IMPRESSION: No acute pneumonia nor pulmonary edema. Chronic elevation of the right hemidiaphragm which limits excursion of the right lung. Mild chronic cardiomegaly. Thoracic aortic atherosclerosis. Electronically Signed   By: David  Swaziland M.D.   On: 03/07/2016 07:56   Dg Chest Portable 1 View  Result Date: 03/06/2016 CLINICAL DATA:  Shortness of breath and productive cough. Seen last week by primary care physician but cough is worsening. EXAM: PORTABLE CHEST 1 VIEW COMPARISON:  11/30/2010 FINDINGS: Shallow inspiration with elevation of the right hemidiaphragm, similar to prior study. Borderline heart size with normal pulmonary vascularity. Calcification of the mitral valve annulus. Calcified and tortuous aorta. No focal airspace disease or consolidation in  the lungs. No pneumothorax. Surgical clips in the base of the neck.  Degenerative changes in the spine and shoulders. IMPRESSION: Shallow inspiration with elevation of the right hemidiaphragm. No evidence of active pulmonary disease. Electronically Signed   By: Burman NievesWilliam  Stevens M.D.   On: 03/06/2016 06:40     ASSESSMENT AND PLAN:   83 year old female with history of atrial fibrillation and dementia who presents with cough and acute hypoxic respiratory failure.  1. Acute hypoxic respiratory failure in the setting of acute bronchitis with negative CXR for CHF/PNA. Stop VANC and ZOSY and start PO AZITHROMYCIN  Follow up on ECHO   2. Narcolepsy: Continued Dexedrine   3. History of atrial fibrillation: Heart rate is controlled.  4. Dementia: Patient is at baseline   PT consult for dispo       Management plans discussed with the patient's daughter and she is in agreement.  CODE STATUS: FULL  TOTAL TIME TAKING CARE OF THIS PATIENT: 26 minutes.     POSSIBLE D/C tomorrow, DEPENDING ON CLINICAL CONDITION.   Joy Hill M.D on 03/07/2016 at 12:49 PM  Between 7am to 6pm - Pager - [phone] After 6pm go to [email or web address] - Social research officer, governmentpassword EPAS [facility]  [facility]  Office  [phone]  CC: Primary care physician; No PCP Per Patient  Note: This dictation was prepared with Dragon dictation along with smaller phrase technology. Any transcriptional errors that result from this process are unintentional.

## 2016-03-07 NOTE — Progress Notes (Signed)
PT Cancellation Note  Patient Details Name: Joy DaviesCarrie Hill MRN: [id number] DOB: 01/22/1926   Cancelled Treatment:    Reason Eval/Treat Not Completed: Patient declined, no reason specified.  Pt initially adamantly stating that she did not want to eat anything and requiring redirection to discuss role of PT and OOB mobility.  Pt then started to cry and refusing to do anything.  PT able to calm pt down but pt adamant on not doing anything with PT (even when redirected with more automatic functional activities).  Pt intermittently falling alseep (pt with PMH of narcolepsy).  Nursing reporting pt does better overall when her daughter is around.  Called pt's daughter [name] who reported she was working today but could be at the hospital at 10 AM tomorrow (Wednesday February 7th).  Will re-attempt PT session tomorrow morning with pt's daughter present.  Irving Burtonmily Trashawn Oquendo 03/07/2016, 1:28 PM Hendricks LimesEmily Lashunda Greis, PT [phone]

## 2016-03-08 NOTE — Clinical Social Work Placement (Signed)
   CLINICAL SOCIAL WORK PLACEMENT  NOTE  Date:  03/08/2016  Patient Details  Name: Joy Hill MRN: [id number] Date of Birth: 05/20/1925  Clinical Social Work is seeking post-discharge placement for this patient at the Skilled  Nursing Facility level of care (*CSW will initial, date and re-position this form in  chart as items are completed):  Yes   Patient/family provided with [facility] Clinical Social Work Department's list of facilities offering this level of care within the geographic area requested by the patient (or if unable, by the patient's family).  Yes   Patient/family informed of their freedom to choose among providers that offer the needed level of care, that participate in Medicare, Medicaid or managed care program needed by the patient, have an available bed and are willing to accept the patient.  Yes   Patient/family informed of [facility]'s ownership interest in [facility] and [facility], as well as of the fact that they are under no obligation to receive care at these facilities.  PASRR submitted to EDS on       PASRR number received on       Existing PASRR number confirmed on 03/06/16     FL2 transmitted to all facilities in geographic area requested by pt/family on 03/08/16     FL2 transmitted to all facilities within larger geographic area on       Patient informed that his/her managed care company has contracts with or will negotiate with certain facilities, including the following:            Patient/family informed of bed offers received.  Patient chooses bed at       Physician recommends and patient chooses bed at      Patient to be transferred to   on  .  Patient to be transferred to facility by       Patient family notified on   of transfer.  Name of family member notified:        PHYSICIAN       Additional Comment:    _______________________________________________ Theo Reither, Darleen CrockerBailey M, LCSW 03/08/2016, 1:34 PM

## 2016-03-08 NOTE — Progress Notes (Addendum)
PT is recommending SNF. Clinical Social Worker (CSW) contacted patient's daughter [name] and made her aware of above. Daughter [place] agreeable to [place] in [place]. FL2 complete and faxed out. CSW will continue to follow and assist as needed.   CSW contacted patient's daughter and presented bed offers. [facility]. [facility] a voicemail making her aware of accepted bed offer.   [employer] Hughes [employer]orporatedBailey Bashar Milam, LCSW [phone]

## 2016-03-08 NOTE — Evaluation (Signed)
Physical Therapy Evaluation Patient Details Name: Joy Hill MRN: [id number] DOB: Jul 02, 1925 Today's Date: 03/08/2016   History of Present Illness  Pt is an 83 y.o. female presenting to hospital with cough and SOB.  Pt admitted to hospital with acute hypoxic respiratory failure in setting of probably HCAP.  PMH includes a-fib, dementia, htn, narcolepsy, and syncope.  Clinical Impression  Prior to hospital admission, pt was modified independent ambulating short distances with RW but was pushed in manual w/c for longer distances (to dining hall).  Pt lives at [facility].  Currently pt is mod to max assist supine to sit and min to mod assist to stand and take a few steps bed to recliner using RW.  Ambulation distance limited d/t pt's overall weakness, decreased activity tolerance, and coughing during activity.  Pt would benefit from skilled PT to address noted impairments and functional limitations.  Recommend pt discharge to STR when medically appropriate.    Follow Up Recommendations SNF    Equipment Recommendations  Other (comment) (pt already has RW and manual w/c)    Recommendations for Other Services       Precautions / Restrictions Precautions Precautions: Fall Restrictions Weight Bearing Restrictions: No      Mobility  Bed Mobility Overal bed mobility: Needs Assistance Bed Mobility: Supine to Sit     Supine to sit: Mod assist;Max assist;HOB elevated     General bed mobility comments: vc's for use of siderail; assist for trunk and B LE's; increased effort and time to perform  Transfers Overall transfer level: Needs assistance Equipment used: Rolling walker (2 wheeled) Transfers: Sit to/from Stand Sit to Stand: Min assist;Mod assist         General transfer comment: x2 trials; assist to initiate stand and come to full upright position; vc's for hand and feet placement required  Ambulation/Gait Ambulation/Gait assistance: Min assist;Mod assist Ambulation  Distance (Feet): 3 Feet Assistive device: Rolling walker (2 wheeled)   Gait velocity: decreased   General Gait Details: decreased B step length/foot clearance/heelstrike; limited distance d/t pt coughing and also d/t overall fatigue; vc's for safety and technique required  Stairs            Wheelchair Mobility    Modified Rankin (Stroke Patients Only)       Balance Overall balance assessment: Needs assistance Sitting-balance support: Bilateral upper extremity supported;Feet supported Sitting balance-Leahy Scale: Fair Sitting balance - Comments: static sitting   Standing balance support: Bilateral upper extremity supported (on RW) Standing balance-Leahy Scale: Fair Standing balance comment: static standing                             Pertinent Vitals/Pain Pain Assessment: No/denies pain  HR 96 bpm at rest and increased to 128-131 bpm briefly with transfer to chair (HR returned to low 100's with sitting rest in chair). O2 93% or greater on 3 L O2 via nasal cannula during session.    Home Living Family/patient expects to be discharged to:: Assisted living               Home Equipment: Dan Humphreys - 2 wheels;Wheelchair - manual Additional Comments: [facility]    Prior Function Level of Independence: Needs assistance   Gait / Transfers Assistance Needed: Pt walks short distances with RW but uses w/c for longer distances (gets pushed to dining hall).  ADL's / Homemaking Assistance Needed: Assist for meals, medications, dressing, showers.  Comments: Pt's daughter denies any  falls in past 6 months.     Hand Dominance        Extremity/Trunk Assessment   Upper Extremity Assessment Upper Extremity Assessment: Difficult to assess due to impaired cognition    Lower Extremity Assessment Lower Extremity Assessment: Difficult to assess due to impaired cognition       Communication   Communication: HOH  Cognition Arousal/Alertness:  Awake/alert Behavior During Therapy: Anxious Overall Cognitive Status: History of cognitive impairments - at baseline (Oriented to person (name but not DOB))                 General Comments: Pt's daughter present entire PT session.    General Comments   Nursing cleared pt for participation in physical therapy.  Pt agreeable to PT session.    Exercises  Mobility training   Assessment/Plan    PT Assessment Patient needs continued PT services  PT Problem List Decreased strength;Decreased activity tolerance;Decreased balance;Decreased mobility;Cardiopulmonary status limiting activity          PT Treatment Interventions DME instruction;Gait training;Functional mobility training;Therapeutic activities;Therapeutic exercise;Balance training;Patient/family education    PT Goals (Current goals can be found in the Care Plan section)  Acute Rehab PT Goals Patient Stated Goal: to be able to walk better PT Goal Formulation: With patient/family Time For Goal Achievement: 03/22/16 Potential to Achieve Goals: Fair    Frequency Min 2X/week   Barriers to discharge Decreased caregiver support      Co-evaluation               End of Session Equipment Utilized During Treatment: Gait belt;Oxygen Activity Tolerance: Patient limited by fatigue Patient left: in chair;with call bell/phone within reach;with chair alarm set;with family/visitor present Nurse Communication: Mobility status;Precautions         Time: 1005-1036 PT Time Calculation (min) (ACUTE ONLY): 31 min   Charges:   PT Evaluation $PT Eval Low Complexity: 1 Procedure PT Treatments $Therapeutic Activity: 8-22 mins   PT G CodesHendricks Limes:        Fleming Prill 03/08/2016, 12:52 PM Hendricks LimesEmily Rithwik Schmieg, PT [phone]

## 2016-03-08 NOTE — Progress Notes (Signed)
[employer] - [place] at [facility]   PATIENT NAME: Joy Hill    MR#:  [id number]  DATE OF BIRTH:  09-03-1925  SUBJECTIVE:  Patient with severe dementia REVIEW OF SYSTEMS:    Review of Systems  Unable to perform ROS: Dementia    Tolerating Diet:yes   DRUG ALLERGIES:  No Known Allergies  VITALS:  Blood pressure (!) 111/51, pulse 98, temperature 97.7 F (36.5 C), temperature source Oral, resp. rate 19, height 5\' 2"  (1.575 m), weight 197 lb 9.6 oz (89.6 kg), SpO2 91 %.  PHYSICAL EXAMINATION:   Physical Exam  Constitutional: She is well-developed, well-nourished, and in no distress. No distress.  HENT:  Head: Normocephalic.  Eyes: No scleral icterus.  Neck: Normal range of motion. Neck supple. No JVD present. No tracheal deviation present.  Cardiovascular: Normal rate and regular rhythm.  Exam reveals no gallop and no friction rub.   Murmur heard. 2/6 Systolic murmur  Pulmonary/Chest: Effort normal and breath sounds normal. No respiratory distress. She has no wheezes. She has no rales. She exhibits no tenderness.  Abdominal: Soft. Bowel sounds are normal. She exhibits no distension and no mass. There is no tenderness. There is no rebound and no guarding.  Musculoskeletal: She exhibits no edema.  Neurological:  Demented, no follow commands.  Skin: Skin is warm. No rash noted. No erythema.      LABORATORY PANEL:   CBC  Recent Labs Lab 03/07/16 0537  WBC 7.4  HGB 13.9  HCT 40.4  PLT 336   ------------------------------------------------------------------------------------------------------------------  Chemistries   Recent Labs Lab 03/06/16 0558 03/07/16 0537  NA 142 139  K 3.9 3.7  CL 106 100*  CO2 30 31  GLUCOSE 100* 95  BUN 13 15  CREATININE 0.94 0.84  CALCIUM 9.8 9.5  AST 18  --   ALT 15  --   ALKPHOS 49  --   BILITOT 0.6  --     ------------------------------------------------------------------------------------------------------------------  Cardiac Enzymes  Recent Labs Lab 03/06/16 0558  TROPONINI <0.03   ------------------------------------------------------------------------------------------------------------------  RADIOLOGY:  X-ray Chest Pa And Lateral  Result Date: 03/07/2016 CLINICAL DATA:  Expiratory failure, productive cough, and wheezing for the past 3 days. History of atrial fibrillation, nonsmoker. EXAM: CHEST  2 VIEW COMPARISON:  Portable chest x-ray of March 06, 2016 and chest x-ray of November 30, 2010. FINDINGS: The right hemidiaphragm is chronically elevated. The interstitial markings of both lungs are coarse but similar to that seen previously. There is no alveolar infiltrate or pleural effusion. The cardiac silhouette is enlarged. There is dense calcification in the mitral valvular annulus. There is calcification in the wall of the thoracic aorta. The pulmonary vascularity is not engorged. There surgical clips in the thyroid bed. The bony thorax exhibits no acute abnormality. IMPRESSION: No acute pneumonia nor pulmonary edema. Chronic elevation of the right hemidiaphragm which limits excursion of the right lung. Mild chronic cardiomegaly. Thoracic aortic atherosclerosis. Electronically Signed   By: David  Swaziland M.D.   On: 03/07/2016 07:56     ASSESSMENT AND PLAN:   83 year old female with history of atrial fibrillation and dementia who presents with cough and acute hypoxic respiratory failure.  1. Acute hypoxic respiratory failure in the setting of acute bronchitis with negative CXR for CHF/PNA. Stopped VANC and ZOSY and on PO AZITHROMYCIN ECHO: Normal left ventricular systolic function with grade 1 diastolic   dysfunction and severely calcified aortic valve with moderate to   severe aortic stenosis. LV EF: 65%.   2.  Narcolepsy: Continued Dexedrine   3. History of atrial  fibrillation: Heart rate is controlled.  4. Dementia: Patient is at baseline   PT: SNF.   Management plans discussed with the patient's daughter and she is in agreement.  CODE STATUS: FULL  TOTAL TIME TAKING CARE OF THIS PATIENT: 26 minutes.   POSSIBLE D/C tomorrow, DEPENDING ON CLINICAL CONDITION.   Shaune Pollackhen, Tolulope Pinkett M.D on 03/08/2016 at 4:39 PM  Between 7am to 6pm - Pager - [phone] After 6pm go to [email or web address] - Social research officer, governmentpassword EPAS [facility]  [facility] [place] [facility]  Office  [phone]  CC: Primary care physician; No PCP Per Patient  Note: This dictation was prepared with Dragon dictation along with smaller phrase technology. Any transcriptional errors that result from this process are unintentional.

## 2016-03-09 MED ORDER — SENNOSIDES-DOCUSATE SODIUM 8.6-50 MG PO TABS: 1.0000 | ORAL_TABLET | Freq: Every evening | ORAL | Status: AC | PRN

## 2016-03-09 NOTE — Progress Notes (Signed)
Patient is medically stable for D[facility] today. Per Gavin Poundeborah [facility]. RN will call report to [facility] and arrange EMS for transport. Clinical Child psychotherapistocial Worker (CSW) sent D/C orders to [facility] [employer] via [employer]. CSW contacted patient's daughter [name] and made her aware of above. Please reconsult if future social work needs arise. CSW signing off.   [employer]Bailey Chabely Norby, LCSW [phone]

## 2016-03-09 NOTE — Care Management Important Message (Signed)
Important Message  Patient Details  Name: Joy Hill MRN: [id number] Date of Birth: 03/12/1925   Medicare Important Message Given:  Yes    Gwenette GreetBrenda S Savvas Roper, RN 03/09/2016, 8:33 AM

## 2016-03-09 NOTE — Discharge Summary (Signed)
[facility] - [place] [facility]   PATIENT NAME: Joy Hill    MR#:  [id number]  DATE OF BIRTH:  Dec 14, 1925  DATE OF ADMISSION:  03/06/2016   ADMITTING PHYSICIAN: Adrian Saran, MD  DATE OF DISCHARGE: 03/09/2016 PRIMARY CARE PHYSICIAN: No PCP Per Patient   ADMISSION DIAGNOSIS:  Pneumonia [J18.9] Acute respiratory failure with hypoxemia (HCC) [J96.01] Congestive heart failure, unspecified congestive heart failure chronicity, unspecified congestive heart failure type (HCC) [I50.9] DISCHARGE DIAGNOSIS:  Active Problems:   Respiratory failure (HCC)   Acute hypoxic respiratory failure in the setting of acute bronchitis  aortic stenosis.  SECONDARY DIAGNOSIS:   Past Medical History:  Diagnosis Date  . Atrial fibrillation (HCC)    seen on multiple prior EKGs  . Dementia   . Hypertension   . Narcolepsy    HOSPITAL COURSE:   83 year old female with history of atrial fibrillation and dementia who presents with cough and acute hypoxic respiratory failure.  1. Acute hypoxic respiratory failure in the setting of acute bronchitis with negative CXR for CHF/PNA. Stopped VANC and ZOSY and on PO AZITHROMYCIN ECHO: Normal left ventricular systolic function with grade 1 diastolic dysfunction and severely calcified aortic valve with moderate to severe aortic stenosis. LV EF: 65%.  2. Narcolepsy: Continued Dexedrine   3. History of atrial fibrillation: Heart rate is controlled.  4. Dementia: Patient is at baseline   PT: SNF.  DISCHARGE CONDITIONS:  Stable, discharge to SNF today. CONSULTS OBTAINED:   DRUG ALLERGIES:  No Known Allergies DISCHARGE MEDICATIONS:   Allergies as of 03/09/2016   No Known Allergies     Medication List    STOP taking these medications   predniSONE 10 MG tablet Commonly known as:  DELTASONE     TAKE these medications   acetaminophen 500 MG tablet Commonly known as:  TYLENOL Take 500 mg by mouth daily.   albuterol  108 (90 Base) MCG/ACT inhaler Commonly known as:  PROVENTIL HFA;VENTOLIN HFA Inhale 2 puffs into the lungs every 4 (four) hours as needed for shortness of breath.   aspirin EC 81 MG tablet Take by mouth daily.   dextroamphetamine 10 MG 24 hr capsule Commonly known as:  DEXEDRINE SPANSULE Take 10 mg by mouth daily.   fluticasone 50 MCG/ACT nasal spray Commonly known as:  FLONASE Place 2 sprays into both nostrils daily.   guaiFENesin 600 MG 12 hr tablet Commonly known as:  MUCINEX Take 600 mg by mouth every 12 (twelve) hours as needed for cough or congestion.   nystatin powder Commonly known as:  MYCOSTATIN/NYSTOP Apply topically 2 (two) times daily. Apply under breast twice daily for redness.   senna-docusate 8.6-50 MG tablet Commonly known as:  Senokot-S Take 1 tablet by mouth at bedtime as needed for mild constipation.        DISCHARGE INSTRUCTIONS:  See AVS.  If you experience worsening of your admission symptoms, develop shortness of breath, life threatening emergency, suicidal or homicidal thoughts you must seek medical attention immediately by calling 911 or calling your MD immediately  if symptoms less severe.  You Must read complete instructions/literature along with all the possible adverse reactions/side effects for all the Medicines you take and that have been prescribed to you. Take any new Medicines after you have completely understood and accpet all the possible adverse reactions/side effects.   Please note  You were cared for by a hospitalist during your hospital stay. If you have any questions about your discharge medications or the  care you received while you were in the hospital after you are discharged, you can call the unit and asked to speak with the hospitalist on call if the hospitalist that took care of you is not available. Once you are discharged, your primary care physician will handle any further medical issues. Please note that NO REFILLS for any  discharge medications will be authorized once you are discharged, as it is imperative that you return to your primary care physician (or establish a relationship with a primary care physician if you do not have one) for your aftercare needs so that they can reassess your need for medications and monitor your lab values.    On the day of Discharge:  VITAL SIGNS:  Blood pressure 104/67, pulse 82, temperature 98.1 F (36.7 C), resp. rate 20, height 5\' 2"  (1.575 m), weight 197 lb 9.6 oz (89.6 kg), SpO2 91 %. PHYSICAL EXAMINATION:  GENERAL:  83 y.o.-year-old patient lying in the bed with no acute distress.  EYES: Pupils equal, round, reactive to light and accommodation. No scleral icterus. Extraocular muscles intact.  HEENT: Head atraumatic, normocephalic.  NECK:  Supple, no jugular venous distention. No thyroid enlargement, no tenderness.  LUNGS: Normal breath sounds bilaterally, no wheezing, rales,rhonchi or crepitation. No use of accessory muscles of respiration.  CARDIOVASCULAR: S1, S2 normal. No murmurs, rubs, or gallops.  ABDOMEN: Soft, non-tender, non-distended. Bowel sounds present. No organomegaly or mass.  EXTREMITIES: No pedal edema, cyanosis, or clubbing.  NEUROLOGIC: unable to exam.  PSYCHIATRIC: The patient is demented. SKIN: No obvious rash, lesion, or ulcer.  DATA REVIEW:   CBC  Recent Labs Lab 03/07/16 0537  WBC 7.4  HGB 13.9  HCT 40.4  PLT 336    Chemistries   Recent Labs Lab 03/06/16 0558 03/07/16 0537  NA 142 139  K 3.9 3.7  CL 106 100*  CO2 30 31  GLUCOSE 100* 95  BUN 13 15  CREATININE 0.94 0.84  CALCIUM 9.8 9.5  AST 18  --   ALT 15  --   ALKPHOS 49  --   BILITOT 0.6  --      Microbiology Results  Results for orders placed or performed during the hospital encounter of 03/06/16  MRSA PCR Screening     Status: None   Collection Time: 03/06/16  8:18 AM  Result Value Ref Range Status   MRSA by PCR NEGATIVE NEGATIVE Final    Comment:        The  GeneXpert MRSA Assay (FDA approved for NASAL specimens only), is one component of a comprehensive MRSA colonization surveillance program. It is not intended to diagnose MRSA infection nor to guide or monitor treatment for MRSA infections.     RADIOLOGY:  No results found.   Management plans discussed with the patient, family and they are in agreement.  CODE STATUS:     Code Status Orders        Start     Ordered   03/06/16 0815  Full code  Continuous     03/06/16 0814    Code Status History    Date Active Date Inactive Code Status Order ID Comments User Context   01/04/2016  5:05 PM 01/05/2016  4:53 PM Full Code [id number]  Altamese Dilling, MD Inpatient      TOTAL TIME TAKING CARE OF THIS PATIENT: 33 minutes.    Shaune Pollack M.D on 03/09/2016 at 8:06 AM  Between 7am to 6pm - Pager - [phone]  After 6pm go  to [email or web address] - Scientist, research (life sciences)password [email or web address]  [facility]  Hospitalists  Office  [phone]  CC: Primary care physician; No PCP Per Patient   Note: This dictation was prepared with Dragon dictation along with smaller phrase technology. Any transcriptional errors that result from this process are unintentional.

## 2016-03-09 NOTE — Progress Notes (Signed)
Pt [facility]. Writer [facility]. Pt being discharged on 2L 02, IV's removed, no distress noted. Otilio JeffersonMadelyn S Fenton, RN

## 2016-03-09 NOTE — Clinical Social Work Placement (Signed)
   CLINICAL SOCIAL WORK PLACEMENT  NOTE  Date:  03/09/2016  Patient Details  Name: Joy DaviesCarrie Albro MRN: [id number] Date of Birth: 08/07/1925  Clinical Social Work is seeking post-discharge placement for this patient at the Skilled  Nursing Facility level of care (*CSW will initial, date and re-position this form in  chart as items are completed):  Yes   Patient/family provided with [facility] Clinical Social Work Department's list of facilities offering this level of care within the geographic area requested by the patient (or if unable, by the patient's family).  Yes   Patient/family informed of their freedom to choose among providers that offer the needed level of care, that participate in Medicare, Medicaid or managed care program needed by the patient, have an available bed and are willing to accept the patient.  Yes   Patient/family informed of [facility]'s ownership interest in [facility] and [facility], as well as of the fact that they are under no obligation to receive care at these facilities.  PASRR submitted to EDS on       PASRR number received on       Existing PASRR number confirmed on 03/06/16     FL2 transmitted to all facilities in geographic area requested by pt/family on 03/08/16     FL2 transmitted to all facilities within larger geographic area on       Patient informed that his/her managed care company has contracts with or will negotiate with certain facilities, including the following:        Yes   Patient/family informed of bed offers received.  Patient chooses bed at  [facility]     Physician recommends and patient chooses bed at      Patient to be transferred to  [facility] ) on 03/09/16.  Patient to be transferred to facility by  [facility] )     Patient family notified on 03/09/16 of transfer.  Name of family member notified:   (Patient's daughter [name] is aware of D/C today. )     PHYSICIAN        Additional Comment:    _______________________________________________ Yulonda Wheeling, Darleen CrockerBailey M, LCSW 03/09/2016, 9:01 AM

## 2016-03-09 NOTE — Discharge Instructions (Signed)
Aspiration and fall precaution. °

## 2016-03-19 ENCOUNTER — Emergency Department: Payer: Medicare Other

## 2016-03-19 ENCOUNTER — Encounter: Payer: Self-pay | Admitting: Emergency Medicine

## 2016-03-19 ENCOUNTER — Inpatient Hospital Stay
Admission: EM | Admit: 2016-03-19 | Discharge: 2016-03-21 | DRG: 193 | Disposition: A | Discharge status: Skilled Nursing Facility | Payer: Medicare Other | Attending: Internal Medicine | Admitting: Internal Medicine

## 2016-03-19 DIAGNOSIS — F0390 Unspecified dementia without behavioral disturbance: Secondary | ICD-10-CM | POA: Diagnosis present

## 2016-03-19 DIAGNOSIS — R740 Nonspecific elevation of levels of transaminase and lactic acid dehydrogenase [LDH]: Secondary | ICD-10-CM

## 2016-03-19 DIAGNOSIS — Z803 Family history of malignant neoplasm of breast: Secondary | ICD-10-CM | POA: Diagnosis not present

## 2016-03-19 DIAGNOSIS — F0280 Dementia in other diseases classified elsewhere without behavioral disturbance: Secondary | ICD-10-CM | POA: Diagnosis not present

## 2016-03-19 DIAGNOSIS — G5790 Unspecified mononeuropathy of unspecified lower limb: Secondary | ICD-10-CM | POA: Diagnosis present

## 2016-03-19 DIAGNOSIS — J9601 Acute respiratory failure with hypoxia: Secondary | ICD-10-CM | POA: Diagnosis present

## 2016-03-19 DIAGNOSIS — Z6834 Body mass index (BMI) 34.0-34.9, adult: Secondary | ICD-10-CM

## 2016-03-19 DIAGNOSIS — R0682 Tachypnea, not elsewhere classified: Secondary | ICD-10-CM

## 2016-03-19 DIAGNOSIS — G3183 Dementia with Lewy bodies: Secondary | ICD-10-CM

## 2016-03-19 DIAGNOSIS — I482 Chronic atrial fibrillation: Secondary | ICD-10-CM | POA: Diagnosis present

## 2016-03-19 DIAGNOSIS — J9811 Atelectasis: Secondary | ICD-10-CM | POA: Diagnosis present

## 2016-03-19 DIAGNOSIS — E860 Dehydration: Secondary | ICD-10-CM | POA: Diagnosis present

## 2016-03-19 DIAGNOSIS — Z66 Do not resuscitate: Secondary | ICD-10-CM | POA: Diagnosis present

## 2016-03-19 DIAGNOSIS — I350 Nonrheumatic aortic (valve) stenosis: Secondary | ICD-10-CM | POA: Diagnosis not present

## 2016-03-19 DIAGNOSIS — Z7189 Other specified counseling: Secondary | ICD-10-CM

## 2016-03-19 DIAGNOSIS — I4891 Unspecified atrial fibrillation: Secondary | ICD-10-CM

## 2016-03-19 DIAGNOSIS — J101 Influenza due to other identified influenza virus with other respiratory manifestations: Secondary | ICD-10-CM | POA: Diagnosis not present

## 2016-03-19 DIAGNOSIS — R410 Disorientation, unspecified: Secondary | ICD-10-CM | POA: Diagnosis present

## 2016-03-19 DIAGNOSIS — J9801 Acute bronchospasm: Secondary | ICD-10-CM | POA: Diagnosis present

## 2016-03-19 DIAGNOSIS — R0603 Acute respiratory distress: Secondary | ICD-10-CM

## 2016-03-19 DIAGNOSIS — I110 Hypertensive heart disease with heart failure: Secondary | ICD-10-CM | POA: Diagnosis present

## 2016-03-19 DIAGNOSIS — Z7982 Long term (current) use of aspirin: Secondary | ICD-10-CM | POA: Diagnosis not present

## 2016-03-19 DIAGNOSIS — I719 Aortic aneurysm of unspecified site, without rupture: Secondary | ICD-10-CM | POA: Diagnosis present

## 2016-03-19 DIAGNOSIS — I5031 Acute diastolic (congestive) heart failure: Secondary | ICD-10-CM | POA: Diagnosis present

## 2016-03-19 DIAGNOSIS — G47419 Narcolepsy without cataplexy: Secondary | ICD-10-CM | POA: Diagnosis present

## 2016-03-19 DIAGNOSIS — R7401 Elevation of levels of liver transaminase levels: Secondary | ICD-10-CM

## 2016-03-19 DIAGNOSIS — E43 Unspecified severe protein-calorie malnutrition: Secondary | ICD-10-CM | POA: Diagnosis present

## 2016-03-19 DIAGNOSIS — Z515 Encounter for palliative care: Secondary | ICD-10-CM | POA: Diagnosis not present

## 2016-03-19 DIAGNOSIS — G629 Polyneuropathy, unspecified: Secondary | ICD-10-CM

## 2016-03-19 LAB — COMPREHENSIVE METABOLIC PANEL
ALK PHOS: 60 U/L (ref 38–126)
ALT: 79 U/L — AB (ref 14–54)
ANION GAP: 8 (ref 5–15)
AST: 89 U/L — ABNORMAL HIGH (ref 15–41)
Albumin: 3.6 g/dL (ref 3.5–5.0)
BILIRUBIN TOTAL: 0.7 mg/dL (ref 0.3–1.2)
BUN: 30 mg/dL — ABNORMAL HIGH (ref 6–20)
CALCIUM: 9.9 mg/dL (ref 8.9–10.3)
CO2: 30 mmol/L (ref 22–32)
CREATININE: 0.97 mg/dL (ref 0.44–1.00)
Chloride: 104 mmol/L (ref 101–111)
GFR calc non Af Amer: 51 mL/min — ABNORMAL LOW (ref >60)
GFR, EST AFRICAN AMERICAN: 59 mL/min — AB (ref >60)
Glucose, Bld: 108 mg/dL — ABNORMAL HIGH (ref 65–99)
Potassium: 4.1 mmol/L (ref 3.5–5.1)
Sodium: 142 mmol/L (ref 135–145)
TOTAL PROTEIN: 7.2 g/dL (ref 6.5–8.1)

## 2016-03-19 LAB — BLOOD GAS, ARTERIAL
Acid-Base Excess: 2.9 mmol/L — ABNORMAL HIGH (ref 0.0–2.0)
Bicarbonate: 28.5 mmol/L — ABNORMAL HIGH (ref 20.0–28.0)
FIO2: 0.32
O2 Saturation: 87 %
PCO2 ART: 46 mmHg (ref 32.0–48.0)
PH ART: 7.4 (ref 7.350–7.450)
Patient temperature: 37
pO2, Arterial: 53 mmHg — ABNORMAL LOW (ref 83.0–108.0)

## 2016-03-19 LAB — CBC WITH DIFFERENTIAL/PLATELET
BASOS ABS: 0 10*3/uL (ref 0–0.1)
BASOS PCT: 0 %
EOS ABS: 0 10*3/uL (ref 0–0.7)
Eosinophils Relative: 1 %
HEMATOCRIT: 45.1 % (ref 35.0–47.0)
HEMOGLOBIN: 14.9 g/dL (ref 12.0–16.0)
Lymphocytes Relative: 34 %
Lymphs Abs: 1.7 10*3/uL (ref 1.0–3.6)
MCH: 31.4 pg (ref 26.0–34.0)
MCHC: 33 g/dL (ref 32.0–36.0)
MCV: 95.3 fL (ref 80.0–100.0)
MONO ABS: 0.6 10*3/uL (ref 0.2–0.9)
Monocytes Relative: 13 %
NEUTROS ABS: 2.7 10*3/uL (ref 1.4–6.5)
NEUTROS PCT: 52 %
Platelets: 306 10*3/uL (ref 150–440)
RBC: 4.74 MIL/uL (ref 3.80–5.20)
RDW: 14.4 % (ref 11.5–14.5)
WBC: 5.1 10*3/uL (ref 3.6–11.0)

## 2016-03-19 LAB — MAGNESIUM: MAGNESIUM: 1.9 mg/dL (ref 1.7–2.4)

## 2016-03-19 LAB — TROPONIN I

## 2016-03-19 LAB — PHOSPHORUS: Phosphorus: 2.8 mg/dL (ref 2.5–4.6)

## 2016-03-19 LAB — TSH: TSH: 1.323 u[IU]/mL (ref 0.350–4.500)

## 2016-03-19 LAB — LACTIC ACID, PLASMA: Lactic Acid, Venous: 1.6 mmol/L (ref 0.5–1.9)

## 2016-03-19 MED ORDER — ACETAMINOPHEN 650 MG RE SUPP: 650.0000 mg | Freq: Four times a day (QID) | RECTAL | Status: DC | PRN

## 2016-03-19 MED ORDER — SODIUM CHLORIDE 0.9 % IV SOLN
INTRAVENOUS | Status: DC
  Administered 2016-03-19: 23:00:00 via INTRAVENOUS

## 2016-03-19 MED ORDER — IPRATROPIUM-ALBUTEROL 0.5-2.5 (3) MG/3ML IN SOLN
3.0000 mL | Freq: Once | RESPIRATORY_TRACT | Status: AC
  Administered 2016-03-19: 3 mL via RESPIRATORY_TRACT
  Filled 2016-03-19: qty 3

## 2016-03-19 MED ORDER — BISACODYL 5 MG PO TBEC: 5.0000 mg | DELAYED_RELEASE_TABLET | Freq: Every day | ORAL | Status: DC | PRN

## 2016-03-19 MED ORDER — DEXTROAMPHETAMINE SULFATE ER 5 MG PO CP24
10.0000 mg | ORAL_CAPSULE | Freq: Every day | ORAL | Status: DC
  Administered 2016-03-20 – 2016-03-21 (×2): 10 mg via ORAL
  Filled 2016-03-19 (×2): qty 2

## 2016-03-19 MED ORDER — ENOXAPARIN SODIUM 40 MG/0.4ML ~~LOC~~ SOLN
40.0000 mg | Freq: Every day | SUBCUTANEOUS | Status: DC
  Administered 2016-03-19 – 2016-03-20 (×2): 40 mg via SUBCUTANEOUS
  Filled 2016-03-19 (×2): qty 0.4

## 2016-03-19 MED ORDER — GUAIFENESIN ER 600 MG PO TB12: 600.0000 mg | ORAL_TABLET | Freq: Two times a day (BID) | ORAL | Status: DC | PRN

## 2016-03-19 MED ORDER — IPRATROPIUM BROMIDE 0.02 % IN SOLN: 0.5000 mg | Freq: Four times a day (QID) | RESPIRATORY_TRACT | Status: DC | PRN

## 2016-03-19 MED ORDER — METHYLPREDNISOLONE SODIUM SUCC 125 MG IJ SOLR
60.0000 mg | Freq: Four times a day (QID) | INTRAMUSCULAR | Status: DC
  Administered 2016-03-19 – 2016-03-21 (×5): 60 mg via INTRAVENOUS
  Filled 2016-03-19 (×5): qty 2

## 2016-03-19 MED ORDER — ALBUTEROL SULFATE (2.5 MG/3ML) 0.083% IN NEBU: 2.5000 mg | INHALATION_SOLUTION | Freq: Four times a day (QID) | RESPIRATORY_TRACT | Status: DC | PRN

## 2016-03-19 MED ORDER — NYSTATIN 100000 UNIT/GM EX POWD
Freq: Two times a day (BID) | CUTANEOUS | Status: DC
  Administered 2016-03-19 – 2016-03-21 (×4): via TOPICAL
  Filled 2016-03-19: qty 15

## 2016-03-19 MED ORDER — OXYCODONE HCL 5 MG PO TABS
5.0000 mg | ORAL_TABLET | ORAL | Status: DC | PRN
  Administered 2016-03-21: 5 mg via ORAL
  Filled 2016-03-19: qty 1

## 2016-03-19 MED ORDER — SENNOSIDES-DOCUSATE SODIUM 8.6-50 MG PO TABS: 1.0000 | ORAL_TABLET | Freq: Every evening | ORAL | Status: DC | PRN

## 2016-03-19 MED ORDER — FLUTICASONE PROPIONATE 50 MCG/ACT NA SUSP
2.0000 | Freq: Every day | NASAL | Status: DC
  Filled 2016-03-19: qty 16

## 2016-03-19 MED ORDER — ONDANSETRON HCL 4 MG PO TABS: 4.0000 mg | ORAL_TABLET | Freq: Four times a day (QID) | ORAL | Status: DC | PRN

## 2016-03-19 MED ORDER — MAGNESIUM CITRATE PO SOLN: 1.0000 | Freq: Once | ORAL | Status: DC | PRN

## 2016-03-19 MED ORDER — METHYLPREDNISOLONE SODIUM SUCC 125 MG IJ SOLR
125.0000 mg | Freq: Once | INTRAMUSCULAR | Status: AC
  Administered 2016-03-19: 125 mg via INTRAVENOUS
  Filled 2016-03-19: qty 2

## 2016-03-19 MED ORDER — ASPIRIN EC 81 MG PO TBEC
81.0000 mg | DELAYED_RELEASE_TABLET | Freq: Every day | ORAL | Status: DC
  Administered 2016-03-20 – 2016-03-21 (×2): 81 mg via ORAL
  Filled 2016-03-19 (×2): qty 1

## 2016-03-19 MED ORDER — SODIUM CHLORIDE 0.9% FLUSH
3.0000 mL | Freq: Two times a day (BID) | INTRAVENOUS | Status: DC
  Administered 2016-03-19 – 2016-03-21 (×4): 3 mL via INTRAVENOUS

## 2016-03-19 MED ORDER — ONDANSETRON HCL 4 MG/2ML IJ SOLN: 4.0000 mg | Freq: Four times a day (QID) | INTRAMUSCULAR | Status: DC | PRN

## 2016-03-19 MED ORDER — IOPAMIDOL (ISOVUE-370) INJECTION 76%
75.0000 mL | Freq: Once | INTRAVENOUS | Status: AC | PRN
  Administered 2016-03-19: 75 mL via INTRAVENOUS

## 2016-03-19 MED ORDER — ACETAMINOPHEN 325 MG PO TABS: 650.0000 mg | ORAL_TABLET | Freq: Four times a day (QID) | ORAL | Status: DC | PRN

## 2016-03-19 NOTE — H&P (Signed)
History and Physical   [facility] - [place] @ [facility] Admission History and Physical [employer], D.O.    Patient Name: Joy Hill MR#: [id number] Date of Birth: Jul 25, 1925 Date of Admission: 03/19/2016  Referring MD/NP/PA: Dr. Shaune Pollack Primary Care Physician: No PCP Per Patient  Patient coming from: Assisted Living  Please note the entire history is obtained from the patient's emergency department chart, emergency department provider and the patient's family who is at the bedside. Patient's personal history is limited by dementia and lethargy.   Chief Complaint: Flu  HPI: Joy Hill is a 83 y.o. female with a known history of afib, dementia, hypertension, narcolepsy was in a usual state of health until two weeks ago she was admitted to the hospital with complaints of cough, SOB thought to be CHF.  She was discharged to skilled nursing on O2 and Rocephin after receiving Zosyn/Vanc inpatient but has become worse since then with SOB, cough, wheezing. Daughter is concerned that she is not improving.  She has progressively worsening symptoms and is now lethargic, decreased by mouth intake.   Of note she tested positive for Flu B at her nursing facility four days ago and is being treated with Tamiflu. Family is not aware of any symptoms such as dysuria, abdominal pain, diarrhea or any wounds.  ED Course: Patient rec'd duonebs, solumedrol  Review of Systems:  Unable to obtain secondary to dementia.    Past Medical History:  Diagnosis Date  . Atrial fibrillation (HCC)    seen on multiple prior EKGs  . Dementia   . Hypertension   . Narcolepsy     History reviewed. No pertinent surgical history.   reports that she has never smoked. She has never used smokeless tobacco. She reports that she does not drink alcohol or use drugs.  No Known Allergies  Family History  Problem Relation Age of Onset  . Breast cancer Paternal Grandmother     Prior to Admission  medications   Medication Sig Start Date End Date Taking? Authorizing Provider  acetaminophen (TYLENOL) 500 MG tablet Take 500 mg by mouth daily.    Historical Provider, MD  albuterol (PROVENTIL HFA;VENTOLIN HFA) 108 (90 Base) MCG/ACT inhaler Inhale 2 puffs into the lungs every 4 (four) hours as needed for shortness of breath.  07/15/14   Historical Provider, MD  aspirin EC 81 MG tablet Take by mouth daily.     Historical Provider, MD  dextroamphetamine (DEXEDRINE SPANSULE) 10 MG 24 hr capsule Take 10 mg by mouth daily.    Historical Provider, MD  fluticasone (FLONASE) 50 MCG/ACT nasal spray Place 2 sprays into both nostrils daily. 03/03/16 03/03/17  Historical Provider, MD  guaiFENesin (MUCINEX) 600 MG 12 hr tablet Take 600 mg by mouth every 12 (twelve) hours as needed for cough or congestion.    Historical Provider, MD  nystatin (MYCOSTATIN/NYSTOP) powder Apply topically 2 (two) times daily. Apply under breast twice daily for redness. 11/24/15 11/23/16  Historical Provider, MD  senna-docusate (SENOKOT-S) 8.6-50 MG tablet Take 1 tablet by mouth at bedtime as needed for mild constipation. 03/09/16   Shaune Pollack, MD    Physical Exam: Vitals:   03/19/16 1755 03/19/16 1800 03/19/16 1900 03/19/16 2025  BP: 126/88 127/68 129/86   Pulse: 97 92  (!) 112  Resp: (!) 34 (!) 27 (!) 24 (!) 26  Temp:      TempSrc:      SpO2: 94% 94%  100%  Weight:      Height:  GENERAL: 83 y.o.-year-old female patient, well-developed, well-nourished lying in the bed in no acute distress, Resting comfortably HEENT: Head atraumatic, normocephalic. Pupils equal, round, reactive to light and accommodation. No scleral icterus. Extraocular muscles intact. Nares are patent. Oropharynx is clear. Mucus membranes dry.  NECK: Supple, full range of motion. No JVD, no bruit heard. No thyroid enlargement, no tenderness, no cervical lymphadenopathy. CHEST: Decreased rib cage excursion, diminished air movement with mild diffuse  wheezing.No use of accessory muscles of respiration.  No reproducible chest wall tenderness.  CARDIOVASCULAR: irregularly irregular  S1, S2 normal. No murmurs, rubs, or gallops. Cap refill <2 seconds. Pulses intact distally.  ABDOMEN: Soft, nondistended, nontender. No rebound, guarding, rigidity. Normoactive bowel sounds present in all four quadrants. No organomegaly or mass. EXTREMITIES: No pedal edema, cyanosis, or clubbing. No calf tenderness or Homan's sign.  NEUROLOGIC: The patient is lethargic but arousable .  PSYCHIATRIC:  Normal affect, mood, thought content.    Labs on Admission:  CBC:  Recent Labs Lab 03/19/16 1642  WBC 5.1  NEUTROABS 2.7  HGB 14.9  HCT 45.1  MCV 95.3  PLT 306   Basic Metabolic Panel:  Recent Labs Lab 03/19/16 1642  NA 142  K 4.1  CL 104  CO2 30  GLUCOSE 108*  BUN 30*  CREATININE 0.97  CALCIUM 9.9   GFR: Estimated Creatinine Clearance: 40.1 mL/min (by C-G formula based on SCr of 0.97 mg/dL). Liver Function Tests:  Recent Labs Lab 03/19/16 1642  AST 89*  ALT 79*  ALKPHOS 60  BILITOT 0.7  PROT 7.2  ALBUMIN 3.6   No results for input(s): LIPASE, AMYLASE in the last 168 hours. No results for input(s): AMMONIA in the last 168 hours. Coagulation Profile: No results for input(s): INR, PROTIME in the last 168 hours. Cardiac Enzymes: No results for input(s): CKTOTAL, CKMB, CKMBINDEX, TROPONINI in the last 168 hours. BNP (last 3 results) No results for input(s): PROBNP in the last 8760 hours. HbA1C: No results for input(s): HGBA1C in the last 72 hours. CBG: No results for input(s): GLUCAP in the last 168 hours. Lipid Profile: No results for input(s): CHOL, HDL, LDLCALC, TRIG, CHOLHDL, LDLDIRECT in the last 72 hours. Thyroid Function Tests: No results for input(s): TSH, T4TOTAL, FREET4, T3FREE, THYROIDAB in the last 72 hours. Anemia Panel: No results for input(s): VITAMINB12, FOLATE, FERRITIN, TIBC, IRON, RETICCTPCT in the last 72  hours. Urine analysis: Sepsis Labs: @LABRCNTIP (procalcitonin:4,lacticidven:4) )No results found for this or any previous visit (from the past 240 hour(s)).   Radiological Exams on Admission: Ct Angio Chest Pe W/cm &/or Wo Cm  Result Date: 03/19/2016 CLINICAL DATA:  Patient with cough and crackles.  Dementia. EXAM: CT ANGIOGRAPHY CHEST WITH CONTRAST TECHNIQUE: Multidetector CT imaging of the chest was performed using the standard protocol during bolus administration of intravenous contrast. Multiplanar CT image reconstructions and MIPs were obtained to evaluate the vascular anatomy. CONTRAST:  75 cc Isovue 370 COMPARISON:  Chest radiograph 03/19/2016 FINDINGS: Cardiovascular: The heart is enlarged. No pericardial effusion. Main pulmonary artery is dilated measuring 3.4 cm. Ascending thoracic aorta measures 4.3 cm. Aortic atherosclerosis. Motion artifact limits evaluation. No evidence for central or main pulmonary embolus. Evaluation of the lobar, segmental and subsegmental pulmonary arteries particularly within the right lung are nondiagnostic due to motion artifact. Mediastinum/Nodes: No enlarged axillary, mediastinal or hilar lymphadenopathy. Small hiatal hernia. Lungs/Pleura: Central airways are patent. Bandlike opacities within the lower lobes bilaterally. No large area pulmonary consolidation. No pleural effusion or pneumothorax. Upper Abdomen: Gallbladder is  distended. Musculoskeletal: Thoracic spine degenerative changes. No aggressive or acute appearing osseous lesions. T12 and L1 vertebral body compression deformities, age indeterminate. Review of the MIP images confirms the above findings. IMPRESSION: Extensive motion artifact limits evaluation. No evidence for central or main pulmonary embolus. Evaluation of the lobar, segmental and subsegmental pulmonary arteries is nondiagnostic due to extensive motion artifact. Subpleural ground-glass and consolidative opacities within the lower lobes  bilaterally favored to represent atelectasis. The gallbladder appears mildly distended. Recommend clinical correlation. Ultrasound can be performed as clinically indicated. Aortic atherosclerosis. Dilated main pulmonary artery as can be seen with pulmonary arterial hypertension. Ascending thoracic aorta measures 4.3 cm. Recommend semi-annual imaging followup by CTA or MRA and referral to cardiothoracic surgery if not already obtained. This recommendation follows 2010 ACCF/AHA/AATS/ACR/ASA/SCA/SCAI/SIR/STS/SVM Guidelines for the Diagnosis and Management of Patients With Thoracic Aortic Disease. Circulation. 2010; 121: e266-e36 Age-indeterminate T12 and L1 compression deformities. Recommend correlation for point tenderness. Electronically Signed   By: Annia Belt M.D.   On: 03/19/2016 19:43   Dg Chest Portable 1 View  Result Date: 03/19/2016 CLINICAL DATA:  Acute onset of wet cough and bilateral lung crackles. Initial encounter. EXAM: PORTABLE CHEST 1 VIEW COMPARISON:  Chest radiograph performed 03/07/2016 FINDINGS: There is elevation of the right hemidiaphragm. The lungs are well-aerated. Mild peribronchial thickening is noted. There is no evidence of focal opacification, pleural effusion or pneumothorax. The cardiomediastinal silhouette is mildly enlarged. No acute osseous abnormalities are seen. Postoperative change is noted about the thyroid bed. IMPRESSION: Elevation of the right hemidiaphragm. Mild peribronchial thickening noted. Mild cardiomegaly. Electronically Signed   By: Roanna Raider M.D.   On: 03/19/2016 17:22    EKG: Atrial fibrillation at 93 bpm with normal axis and nonspecific ST-T wave changes.   Assessment/Plan  This is a 83 y.o. female with a history of afib, dementia, hypertension, narcolepsy now being admitted with:  1. Acute respiratory failure, unclear source possibly related to bronchitis with flu +/_ HCAP. Patient has no known history of lung disease CT and-year-old significant  for groundglass and consolidative opacities in the lower lobes bilaterally favoring atelectasis.  She has been treated for pneumonia, completing a course of azithromycin and is also on Tamiflu for confirmed influenza B.  - Admit inpatient, with telemetry - Continue IV abx - Expectorants, O2 and neb therapy as needed - Continue IV steroid taper started in ER - Continue Tamiflu - Follow up blood and sputum cultures - ID consultation has been requested.   2. Afib with RVR, not on any rate controllers - Continue aspirin  3. Dementia, not currently medicated  4. Narcolepsy - continue Dexedrine  5. Descending aortic aneurysm 4.3 cm recommending semiannual imaging with CTA or MRI and referral to cardiothoracic surgery   Admission status: Inpatient, telemetry IV Fluids: NS Diet/Nutrition: Heart healthy Consults called: ID, speech, PT DVT Px: Lovenox, SCDs and early ambulation. Code Status: DNR per daughter  Disposition Plan: To home in 1-2 days   All the records are reviewed and case discussed with ED provider. Management plans discussed with the patient and/or family who express understanding and agree with plan of care.  Rosela Supak D.O. on 03/19/2016 at 8:51 PM Between 7am to 6pm - Pager - [phone] After 6pm go to [email or web address] - [employer]ical [employer] [facility] Office [phone] CC: Primary care physician; No PCP Per Patient   03/19/2016, 8:51 PM

## 2016-03-19 NOTE — ED Triage Notes (Signed)
Pt [facility]. Dx with influenza B 03/15/16, has been on tamiflu and states she is not feeling any better. +wet cough, +crackles bil lungs. Pt hx dementia, is unable to answer questions but cries out in pain anywhere she is touched.

## 2016-03-19 NOTE — ED Provider Notes (Signed)
[facility] Emergency Department Provider Note ____________________________________________   I have reviewed the triage vital signs and the triage nursing note.  HISTORY  Chief Complaint Influenza   Historian Patient history is limited as patient is a very poor historian Daughter provides history, patient is currently staying at acute care rehabilitation  HPI Joy Hill is a 83 y.o. female was apparently diagnosed with wheezing and treated in the hospital with prednisone several weeks ago and then discharged to nursing facility, where she was getting slightly worse and was then diagnosed with influenza B, about 4 days ago and is currently on Tamiflu for that, but has been getting worse in terms of shortness of breath, and O2 requirement. She apparently went to the nursing home on 2 L nasal cannula. Patient has continued to be wheezing and coughing, somewhat worse that she was a few days ago per her daughter. Patient really has not complained herself of dyspnea or chest pain, but she is actually poor historian.  Daughter states that she thinks she's been told that there is no irregular heartbeat on prior hospital stays, but she does not remember any plan of action, and the patient is not currently on any anticoagulation.    Past Medical History:  Diagnosis Date  . Atrial fibrillation (HCC)    seen on multiple prior EKGs  . Dementia   . Hypertension   . Narcolepsy     Patient Active Problem List   Diagnosis Date Noted  . Respiratory failure (HCC) 03/06/2016  . Syncopal episodes 01/04/2016  . Syncope 01/04/2016    History reviewed. No pertinent surgical history.  Prior to Admission medications   Medication Sig Start Date End Date Taking? Authorizing Provider  acetaminophen (TYLENOL) 500 MG tablet Take 500 mg by mouth daily.    Historical Provider, MD  albuterol (PROVENTIL HFA;VENTOLIN HFA) 108 (90 Base) MCG/ACT inhaler Inhale 2 puffs into the lungs  every 4 (four) hours as needed for shortness of breath.  07/15/14   Historical Provider, MD  aspirin EC 81 MG tablet Take by mouth daily.     Historical Provider, MD  dextroamphetamine (DEXEDRINE SPANSULE) 10 MG 24 hr capsule Take 10 mg by mouth daily.    Historical Provider, MD  fluticasone (FLONASE) 50 MCG/ACT nasal spray Place 2 sprays into both nostrils daily. 03/03/16 03/03/17  Historical Provider, MD  guaiFENesin (MUCINEX) 600 MG 12 hr tablet Take 600 mg by mouth every 12 (twelve) hours as needed for cough or congestion.    Historical Provider, MD  nystatin (MYCOSTATIN/NYSTOP) powder Apply topically 2 (two) times daily. Apply under breast twice daily for redness. 11/24/15 11/23/16  Historical Provider, MD  senna-docusate (SENOKOT-S) 8.6-50 MG tablet Take 1 tablet by mouth at bedtime as needed for mild constipation. 03/09/16   Shaune Pollack, MD    No Known Allergies  Family History  Problem Relation Age of Onset  . Breast cancer Paternal Grandmother     Social History Social History  Substance Use Topics  . Smoking status: Never Smoker  . Smokeless tobacco: Never Used  . Alcohol use No    Review of Systems Limited review of systems due to patient unable to provide history Constitutional: Negative for fever. Eyes: Negative for visual changes. ENT:  Cardiovascular: Negative for chest pain. Respiratory: Positive for shortness of breath. Gastrointestinal: Negative for abdominal pain Genitourinary:  Musculoskeletal:  Skin: Negative for rash. Neurological: Negative for headache. 10 point Review of Systems otherwise negative ____________________________________________   PHYSICAL EXAM:  VITAL  SIGNS: ED Triage Vitals  Enc Vitals Group     BP 03/19/16 1638 130/67     Pulse Rate 03/19/16 1638 93     Resp 03/19/16 1638 20     Temp 03/19/16 1638 98.7 F (37.1 C)     Temp Source 03/19/16 1638 Oral     SpO2 03/19/16 1638 100 %     Weight 03/19/16 1640 183 lb (83 kg)     Height  03/19/16 1640 5\' 2"  (1.575 m)     Head Circumference --      Peak Flow --      Pain Score --      Pain Loc --      Pain Edu? --      Excl. in GC? --      Constitutional: Alert to voice, but closes eyes easily, cooperative but disorient. Marland Kitchen HEENT   Head: Normocephalic and atraumatic.      Eyes: Conjunctivae are normal. PERRL. Normal extraocular movements.      Ears:         Nose: No congestion/rhinnorhea.   Mouth/Throat: Mucous membranes are moist.   Neck: No stridor. Cardiovascular/Chest: Tachycardic, irregularly irregular.  No murmurs, rubs, or gallops. Respiratory: Tachypnea, moderate to severe wheezing in all fields, some decreased air movement overall. Gastrointestinal: Soft. No distention, no guarding, no rebound. Nontender.    Genitourinary/rectal:Deferred Musculoskeletal: Nontender extremities Neurologic: No facial droop.  No slurred speech.  No gross or focal neurologic deficits are appreciated. Skin:  Skin is warm, dry and intact. No rash noted. Psychiatric: No agitation.   ____________________________________________  LABS (pertinent positives/negatives)  Labs Reviewed  COMPREHENSIVE METABOLIC PANEL - Abnormal; Notable for the following:       Result Value   Glucose, Bld 108 (*)    BUN 30 (*)    AST 89 (*)    ALT 79 (*)    GFR calc non Af Amer 51 (*)    GFR calc Af Amer 59 (*)    All other components within normal limits  LACTIC ACID, PLASMA  CBC WITH DIFFERENTIAL/PLATELET    ____________________________________________    EKG I, Governor Rooks, MD, the attending physician have personally viewed and interpreted all ECGs.  93 bpm. Atrial fibrillation. Narrow QRS. Normal axis. Nonspecific ST and T-wave ____________________________________________  RADIOLOGY All Xrays were viewed by me. Imaging interpreted by Radiologist.  Chest portable x-ray:  IMPRESSION: Elevation of the right hemidiaphragm. Mild peribronchial thickening noted. Mild  cardiomegaly.  CT angiogram chest:  IMPRESSION: Extensive motion artifact limits evaluation. No evidence for central or main pulmonary embolus. Evaluation of the lobar, segmental and subsegmental pulmonary arteries is nondiagnostic due to extensive motion artifact.  Subpleural ground-glass and consolidative opacities within the lower lobes bilaterally favored to represent atelectasis.  The gallbladder appears mildly distended. Recommend clinical correlation. Ultrasound can be performed as clinically indicated.  Aortic atherosclerosis.  Dilated main pulmonary artery as can be seen with pulmonary arterial hypertension.  Ascending thoracic aorta measures 4.3 cm. Recommend semi-annual imaging followup by CTA or MRA and referral to cardiothoracic surgery if not already obtained. This recommendation follows 2010 ACCF/AHA/AATS/ACR/ASA/SCA/SCAI/SIR/STS/SVM Guidelines for the Diagnosis and Management of Patients With Thoracic Aortic Disease. Circulation. 2010; 121: [id number]  Age-indeterminate T12 and L1 compression deformities. Recommend correlation for point tenderness.  __________________________________________  PROCEDURES  Procedure(s) performed: None  Critical Care performed: None  ____________________________________________   ED COURSE / ASSESSMENT AND PLAN  Pertinent labs & imaging results that were available during my care of the  patient were reviewed by me and considered in my medical decision making (see chart for details).    Joy Hill was sent in here by her nursing home for worsening respiratory status, and her daughter is here providing most of the history. This patient has no underlying lung disease prior to a few weeks ago and the patient was admitted for what is noted in the chart as CHF, and bronchitis/bronchospasm. At the time she was treated for possible pneumonia, and completed azithromycin. She left the hospital on 2 L nasal cannula O2. It's  noted in that hospital stay that she had prior diagnosis of atrial fibrillation, and rate was controlled. Patient is not on any anticoagulation for that. Daughter doesn't know anything about that.  Today the patient looks like she is certainly having trouble breathing was still very bad wheezing. I am going treat her with Solu-Medrol, and admitted to the hospitalist with respiratory distress. She is having tachypnea up in the 30s at times, and is hypoxic into the 80s on room air, 90% on 2 L, 94% on 3 L.  I did choose to go further with imaging with a chest CT to rule out pulmonary embolism, and this was negative for pulmonary embolism. There is evidence of groundglass opacification favoring atelectasis, and based on the patient not having had a fever, or elevated white blood cell count, I'm less suspicious for new pneumonia. I will defer any additional antibiotic coverage to the hospitalist admission team.    CONSULTATIONS:  Hospitalist for admission   Patient / Family / Caregiver informed of clinical course, medical decision-making process, and agree with plan.  __________________________________________   FINAL CLINICAL IMPRESSION(S) / ED DIAGNOSES   Final diagnoses:  Respiratory distress  Atrial fibrillation with rapid ventricular response (HCC)  Tachypnea  Bronchospasm              Note: This dictation was prepared with Dragon dictation. Any transcriptional errors that result from this process are unintentional    Governor Rooksebecca Demetrius Mahler, MD 03/19/16 2006

## 2016-03-19 NOTE — ED Notes (Signed)
EMS states pt O2 sat 91% on chronic 2L, 98% on 4L. Upon arrival to ED pt sat 97% on 2L.

## 2016-03-20 ENCOUNTER — Inpatient Hospital Stay: Payer: Medicare Other

## 2016-03-20 LAB — TROPONIN I: Troponin I: <0.03 ng/mL (ref <0.03)

## 2016-03-20 LAB — BASIC METABOLIC PANEL
Anion gap: 9 (ref 5–15)
BUN: 29 mg/dL — AB (ref 6–20)
CHLORIDE: 106 mmol/L (ref 101–111)
CO2: 28 mmol/L (ref 22–32)
CREATININE: 0.79 mg/dL (ref 0.44–1.00)
Calcium: 9.6 mg/dL (ref 8.9–10.3)
GFR calc non Af Amer: >60 mL/min (ref >60)
Glucose, Bld: 195 mg/dL — ABNORMAL HIGH (ref 65–99)
Potassium: 4.7 mmol/L (ref 3.5–5.1)
SODIUM: 143 mmol/L (ref 135–145)

## 2016-03-20 LAB — MRSA PCR SCREENING: MRSA by PCR: NEGATIVE

## 2016-03-20 LAB — CBC
HCT: 43.4 % (ref 35.0–47.0)
Hemoglobin: 14.4 g/dL (ref 12.0–16.0)
MCH: 31.4 pg (ref 26.0–34.0)
MCHC: 33.1 g/dL (ref 32.0–36.0)
MCV: 95.1 fL (ref 80.0–100.0)
PLATELETS: 307 10*3/uL (ref 150–440)
RBC: 4.57 MIL/uL (ref 3.80–5.20)
RDW: 13.9 % (ref 11.5–14.5)
WBC: 4.6 10*3/uL (ref 3.6–11.0)

## 2016-03-20 LAB — PROCALCITONIN: Procalcitonin: <0.1 ng/mL

## 2016-03-20 MED ORDER — FUROSEMIDE 40 MG PO TABS: 40.0000 mg | ORAL_TABLET | Freq: Once | ORAL | Status: DC

## 2016-03-20 MED ORDER — PREGABALIN 25 MG PO CAPS
25.0000 mg | ORAL_CAPSULE | Freq: Two times a day (BID) | ORAL | Status: DC
  Administered 2016-03-20 – 2016-03-21 (×3): 25 mg via ORAL
  Filled 2016-03-20 (×3): qty 1

## 2016-03-20 MED ORDER — ENSURE ENLIVE PO LIQD
237.0000 mL | Freq: Two times a day (BID) | ORAL | Status: DC
  Administered 2016-03-21: 237 mL via ORAL

## 2016-03-20 MED ORDER — VANCOMYCIN HCL IN DEXTROSE 1-5 GM/200ML-% IV SOLN
1000.0000 mg | Freq: Once | INTRAVENOUS | Status: AC
  Administered 2016-03-20: 03:00:00 1000 mg via INTRAVENOUS
  Filled 2016-03-20: qty 200

## 2016-03-20 MED ORDER — OSELTAMIVIR PHOSPHATE 30 MG PO CAPS
30.0000 mg | ORAL_CAPSULE | Freq: Two times a day (BID) | ORAL | Status: DC
  Administered 2016-03-20 – 2016-03-21 (×2): 30 mg via ORAL
  Filled 2016-03-20 (×2): qty 1

## 2016-03-20 MED ORDER — FUROSEMIDE 20 MG PO TABS
20.0000 mg | ORAL_TABLET | Freq: Once | ORAL | Status: AC
  Administered 2016-03-20: 10:00:00 20 mg via ORAL
  Filled 2016-03-20: qty 1

## 2016-03-20 MED ORDER — OSELTAMIVIR PHOSPHATE 75 MG PO CAPS
75.0000 mg | ORAL_CAPSULE | Freq: Two times a day (BID) | ORAL | Status: DC
  Administered 2016-03-20: 75 mg via ORAL
  Filled 2016-03-20: qty 1

## 2016-03-20 MED ORDER — RISAQUAD PO CAPS
2.0000 | ORAL_CAPSULE | Freq: Three times a day (TID) | ORAL | Status: DC
  Administered 2016-03-20 – 2016-03-21 (×4): 2 via ORAL
  Filled 2016-03-20 (×4): qty 2

## 2016-03-20 MED ORDER — DEXTROSE 5 % IV SOLN
2.0000 g | INTRAVENOUS | Status: DC
  Administered 2016-03-20: 03:00:00 2 g via INTRAVENOUS
  Filled 2016-03-20 (×2): qty 2

## 2016-03-20 MED ORDER — SODIUM CHLORIDE 0.9 % IV SOLN
INTRAVENOUS | Status: AC
  Administered 2016-03-20: 17:00:00 via INTRAVENOUS

## 2016-03-20 MED ORDER — VANCOMYCIN HCL IN DEXTROSE 750-5 MG/150ML-% IV SOLN
750.0000 mg | INTRAVENOUS | Status: DC
  Administered 2016-03-20: 750 mg via INTRAVENOUS
  Filled 2016-03-20: qty 150

## 2016-03-20 NOTE — Progress Notes (Signed)
Tamiflu Renal Adjustment  Tamiflu was renally adjusted to 30 mg PO BID x 5 days for CrCl <60 ml/min.   Demetrius Charityeldrin D. Tammey Deeg, PharmD

## 2016-03-20 NOTE — Consult Note (Signed)
[facility] Infectious Disease     Reason for Consult:HCAP    Referring Physician: Hugelmeyer Date of Admission:  03/19/2016   Active Problems:   Acute respiratory failure (HCC)   HPI: Joy Hill is a 83 y.o. female admitted with worsening cough, sob, lethargy and found in Afib Was admitted 2/4 with similar findings, Flu PCR negative at that time but had + flu B test as otpt.   On admit afebrile, WBC nml. CT chest done and no infiltrate. Has not been on O2.   Prior to last admission she had previously been given pred and cough suppressants as otpt.  She was treated with IV vanco zosyn and azithro, then dced on no abx.   Past Medical History:  Diagnosis Date  . Atrial fibrillation ([place])    seen on multiple prior EKGs  . Dementia   . Hypertension   . Narcolepsy    History reviewed. No pertinent surgical history. Social History  Substance Use Topics  . Smoking status: Never Smoker  . Smokeless tobacco: Never Used  . Alcohol use No   Family History  Problem Relation Age of Onset  . Breast cancer Paternal Grandmother     Allergies: No Known Allergies  Current antibiotics: Antibiotics Given (last 72 hours)    Date/Time Action Medication Dose Rate   03/20/16 0230 Given   ceFEPIme (MAXIPIME) 2 g in dextrose 5 % 50 mL IVPB 2 g 100 mL/hr   03/20/16 0314 Given   vancomycin (VANCOCIN) IVPB 1000 mg/200 mL premix 1,000 mg 200 mL/hr   03/20/16 0951 Given   oseltamivir (TAMIFLU) capsule 75 mg 75 mg       MEDICATIONS: . acidophilus  2 capsule Oral TID  . aspirin EC  81 mg Oral Daily  . ceFEPime (MAXIPIME) IV  2 g Intravenous Q24H  . dextroamphetamine  10 mg Oral Daily  . enoxaparin (LOVENOX) injection  40 mg Subcutaneous QHS  . fluticasone  2 spray Each Nare Daily  . methylPREDNISolone (SOLU-MEDROL) injection  60 mg Intravenous Q6H  . nystatin   Topical BID  . oseltamivir  30 mg Oral BID  . pregabalin  25 mg Oral BID  . sodium chloride flush  3 mL Intravenous Q12H   . vancomycin  750 mg Intravenous Q24H    Review of Systems - unable to obtain  OBJECTIVE: Temp:  [97.8 F (36.6 C)-98.7 F (37.1 C)] 97.8 F (36.6 C) (02/19 0404) Pulse Rate:  [60-112] 87 (02/19 0404) Resp:  [15-34] 20 (02/19 0404) BP: (102-143)/(51-91) 120/73 (02/19 0404) SpO2:  [94 %-100 %] 96 % (02/19 0404) Weight:  [81.4 kg (179 lb 8 oz)-83 kg (183 lb)] 81.4 kg (179 lb 8 oz) (02/18 2317) Physical Exam  Constitutional:  Sleeping but arousable HENT: /AT, PERRLA, no scleral icterus Mouth/Throat: Oropharynx is clear and dry. No oropharyngeal exudate.  Cardiovascular: Normal rate, regular rhythm and normal heart sounds. Pulmonary/Chest: mild rhonchi bilaterally  Neck = supple, no nuchal rigidity Abdominal: Soft. Bowel sounds are normal.  exhibits no distension. There is no tenderness.  Lymphadenopathy: no cervical adenopathy. No axillary adenopathy Neurological: confused, cannot tell me where she is, calls her daughter momma, has a teddybear in her arms she calls her baby girl Skin: Skin is warm and dry. No rash noted. No erythema.  Psychiatric: tearful  LABS: Results for orders placed or performed during the hospital encounter of 03/19/16 (from the past 48 hour(s))  Comprehensive metabolic panel     Status: Abnormal   Collection  Time: 03/19/16  4:42 PM  Result Value Ref Range   Sodium 142 135 - 145 mmol/L   Potassium 4.1 3.5 - 5.1 mmol/L   Chloride 104 101 - 111 mmol/L   CO2 30 22 - 32 mmol/L   Glucose, Bld 108 (H) 65 - 99 mg/dL   BUN 30 (H) 6 - 20 mg/dL   Creatinine, Ser 0.97 0.44 - 1.00 mg/dL   Calcium 9.9 8.9 - 10.3 mg/dL   Total Protein 7.2 6.5 - 8.1 g/dL   Albumin 3.6 3.5 - 5.0 g/dL   AST 89 (H) 15 - 41 U/L   ALT 79 (H) 14 - 54 U/L   Alkaline Phosphatase 60 38 - 126 U/L   Total Bilirubin 0.7 0.3 - 1.2 mg/dL   GFR calc non Af Amer 51 (L) >60 mL/min   GFR calc Af Amer 59 (L) >60 mL/min    Comment: (NOTE) The eGFR has been calculated using the CKD EPI  equation. This calculation has not been validated in all clinical situations. eGFR's persistently <60 mL/min signify possible Chronic Kidney Disease.    Anion gap 8 5 - 15  CBC with Differential     Status: None   Collection Time: 03/19/16  4:42 PM  Result Value Ref Range   WBC 5.1 3.6 - 11.0 K/uL   RBC 4.74 3.80 - 5.20 MIL/uL   Hemoglobin 14.9 12.0 - 16.0 g/dL   HCT 45.1 35.0 - 47.0 %   MCV 95.3 80.0 - 100.0 fL   MCH 31.4 26.0 - 34.0 pg   MCHC 33.0 32.0 - 36.0 g/dL   RDW 14.4 11.5 - 14.5 %   Platelets 306 150 - 440 K/uL   Neutrophils Relative % 52 %   Neutro Abs 2.7 1.4 - 6.5 K/uL   Lymphocytes Relative 34 %   Lymphs Abs 1.7 1.0 - 3.6 K/uL   Monocytes Relative 13 %   Monocytes Absolute 0.6 0.2 - 0.9 K/uL   Eosinophils Relative 1 %   Eosinophils Absolute 0.0 0 - 0.7 K/uL   Basophils Relative 0 %   Basophils Absolute 0.0 0 - 0.1 K/uL  Magnesium     Status: None   Collection Time: 03/19/16  4:42 PM  Result Value Ref Range   Magnesium 1.9 1.7 - 2.4 mg/dL  Phosphorus     Status: None   Collection Time: 03/19/16  4:42 PM  Result Value Ref Range   Phosphorus 2.8 2.5 - 4.6 mg/dL  TSH     Status: None   Collection Time: 03/19/16  4:42 PM  Result Value Ref Range   TSH 1.323 0.350 - 4.500 uIU/mL    Comment: Performed by a 3rd Generation assay with a functional sensitivity of <=0.01 uIU/mL.  Troponin I     Status: None   Collection Time: 03/19/16  4:42 PM  Result Value Ref Range   Troponin I <0.03 <0.03 ng/mL  Lactic acid, plasma     Status: None   Collection Time: 03/19/16  4:43 PM  Result Value Ref Range   Lactic Acid, Venous 1.6 0.5 - 1.9 mmol/L  Blood gas, arterial     Status: Abnormal   Collection Time: 03/19/16  8:07 PM  Result Value Ref Range   FIO2 0.32    Delivery systems NASAL CANNULA    pH, Arterial 7.40 7.350 - 7.450   pCO2 arterial 46 32.0 - 48.0 mmHg   pO2, Arterial 53 (L) 83.0 - 108.0 mmHg   Bicarbonate 28.5 (H)  20.0 - 28.0 mmol/L   Acid-Base Excess 2.9  (H) 0.0 - 2.0 mmol/L   O2 Saturation 87.0 %   Patient temperature 37.0    Collection site RIGHT RADIAL    Sample type ARTERIAL DRAW    Allens test (pass/fail) PASS PASS  Troponin I     Status: None   Collection Time: 03/19/16 11:31 PM  Result Value Ref Range   Troponin I <0.03 <0.03 ng/mL  MRSA PCR Screening     Status: None   Collection Time: 03/19/16 11:32 PM  Result Value Ref Range   MRSA by PCR NEGATIVE NEGATIVE    Comment:        The GeneXpert MRSA Assay (FDA approved for NASAL specimens only), is one component of a comprehensive MRSA colonization surveillance program. It is not intended to diagnose MRSA infection nor to guide or monitor treatment for MRSA infections.   Basic metabolic panel     Status: Abnormal   Collection Time: 03/20/16  5:02 AM  Result Value Ref Range   Sodium 143 135 - 145 mmol/L   Potassium 4.7 3.5 - 5.1 mmol/L   Chloride 106 101 - 111 mmol/L   CO2 28 22 - 32 mmol/L   Glucose, Bld 195 (H) 65 - 99 mg/dL   BUN 29 (H) 6 - 20 mg/dL   Creatinine, Ser 0.79 0.44 - 1.00 mg/dL   Calcium 9.6 8.9 - 10.3 mg/dL   GFR calc non Af Amer >60 >60 mL/min   GFR calc Af Amer >60 >60 mL/min    Comment: (NOTE) The eGFR has been calculated using the CKD EPI equation. This calculation has not been validated in all clinical situations. eGFR's persistently <60 mL/min signify possible Chronic Kidney Disease.    Anion gap 9 5 - 15  CBC     Status: None   Collection Time: 03/20/16  5:02 AM  Result Value Ref Range   WBC 4.6 3.6 - 11.0 K/uL   RBC 4.57 3.80 - 5.20 MIL/uL   Hemoglobin 14.4 12.0 - 16.0 g/dL   HCT 43.4 35.0 - 47.0 %   MCV 95.1 80.0 - 100.0 fL   MCH 31.4 26.0 - 34.0 pg   MCHC 33.1 32.0 - 36.0 g/dL   RDW 13.9 11.5 - 14.5 %   Platelets 307 150 - 440 K/uL  Troponin I     Status: None   Collection Time: 03/20/16  5:02 AM  Result Value Ref Range   Troponin I <0.03 <0.03 ng/mL   No components found for: ESR, C REACTIVE PROTEIN MICRO: Recent Results  (from the past 720 hour(s))  MRSA PCR Screening     Status: None   Collection Time: 03/06/16  8:18 AM  Result Value Ref Range Status   MRSA by PCR NEGATIVE NEGATIVE Final    Comment:        The GeneXpert MRSA Assay (FDA approved for NASAL specimens only), is one component of a comprehensive MRSA colonization surveillance program. It is not intended to diagnose MRSA infection nor to guide or monitor treatment for MRSA infections.   MRSA PCR Screening     Status: None   Collection Time: 03/19/16 11:32 PM  Result Value Ref Range Status   MRSA by PCR NEGATIVE NEGATIVE Final    Comment:        The GeneXpert MRSA Assay (FDA approved for NASAL specimens only), is one component of a comprehensive MRSA colonization surveillance program. It is not intended to diagnose MRSA infection nor  to guide or monitor treatment for MRSA infections.     IMAGING: X-ray Chest Pa And Lateral  Result Date: 03/07/2016 CLINICAL DATA:  Expiratory failure, productive cough, and wheezing for the past 3 days. History of atrial fibrillation, nonsmoker. EXAM: CHEST  2 VIEW COMPARISON:  Portable chest x-ray of March 06, 2016 and chest x-ray of November 30, 2010. FINDINGS: The right hemidiaphragm is chronically elevated. The interstitial markings of both lungs are coarse but similar to that seen previously. There is no alveolar infiltrate or pleural effusion. The cardiac silhouette is enlarged. There is dense calcification in the mitral valvular annulus. There is calcification in the wall of the thoracic aorta. The pulmonary vascularity is not engorged. There surgical clips in the thyroid bed. The bony thorax exhibits no acute abnormality. IMPRESSION: No acute pneumonia nor pulmonary edema. Chronic elevation of the right hemidiaphragm which limits excursion of the right lung. Mild chronic cardiomegaly. Thoracic aortic atherosclerosis. Electronically Signed   By: Aarron Wierzbicki  [place] M.D.   On: 03/07/2016 07:56   Ct Angio  Chest Pe W/cm &/or Wo Cm  Result Date: 03/19/2016 CLINICAL DATA:  Patient with cough and crackles.  Dementia. EXAM: CT ANGIOGRAPHY CHEST WITH CONTRAST TECHNIQUE: Multidetector CT imaging of the chest was performed using the standard protocol during bolus administration of intravenous contrast. Multiplanar CT image reconstructions and MIPs were obtained to evaluate the vascular anatomy. CONTRAST:  75 cc Isovue 370 COMPARISON:  Chest radiograph 03/19/2016 FINDINGS: Cardiovascular: The heart is enlarged. No pericardial effusion. Main pulmonary artery is dilated measuring 3.4 cm. Ascending thoracic aorta measures 4.3 cm. Aortic atherosclerosis. Motion artifact limits evaluation. No evidence for central or main pulmonary embolus. Evaluation of the lobar, segmental and subsegmental pulmonary arteries particularly within the right lung are nondiagnostic due to motion artifact. Mediastinum/Nodes: No enlarged axillary, mediastinal or hilar lymphadenopathy. Small hiatal hernia. Lungs/Pleura: Central airways are patent. Bandlike opacities within the lower lobes bilaterally. No large area pulmonary consolidation. No pleural effusion or pneumothorax. Upper Abdomen: Gallbladder is distended. Musculoskeletal: Thoracic spine degenerative changes. No aggressive or acute appearing osseous lesions. T12 and L1 vertebral body compression deformities, age indeterminate. Review of the MIP images confirms the above findings. IMPRESSION: Extensive motion artifact limits evaluation. No evidence for central or main pulmonary embolus. Evaluation of the lobar, segmental and subsegmental pulmonary arteries is nondiagnostic due to extensive motion artifact. Subpleural ground-glass and consolidative opacities within the lower lobes bilaterally favored to represent atelectasis. The gallbladder appears mildly distended. Recommend clinical correlation. Ultrasound can be performed as clinically indicated. Aortic atherosclerosis. Dilated main  pulmonary artery as can be seen with pulmonary arterial hypertension. Ascending thoracic aorta measures 4.3 cm. Recommend semi-annual imaging followup by CTA or MRA and referral to cardiothoracic surgery if not already obtained. This recommendation follows 2010 ACCF/AHA/AATS/ACR/ASA/SCA/SCAI/SIR/STS/SVM Guidelines for the Diagnosis and Management of Patients With Thoracic Aortic Disease. Circulation. 2010; 121: e266-e36 Age-indeterminate T12 and L1 compression deformities. Recommend correlation for point tenderness. Electronically Signed   By: Lovey Newcomer M.D.   On: 03/19/2016 19:43   Dg Chest Portable 1 View  Result Date: 03/19/2016 CLINICAL DATA:  Acute onset of wet cough and bilateral lung crackles. Initial encounter. EXAM: PORTABLE CHEST 1 VIEW COMPARISON:  Chest radiograph performed 03/07/2016 FINDINGS: There is elevation of the right hemidiaphragm. The lungs are well-aerated. Mild peribronchial thickening is noted. There is no evidence of focal opacification, pleural effusion or pneumothorax. The cardiomediastinal silhouette is mildly enlarged. No acute osseous abnormalities are seen. Postoperative change is noted about the thyroid  bed. IMPRESSION: Elevation of the right hemidiaphragm. Mild peribronchial thickening noted. Mild cardiomegaly. Electronically Signed   By: Garald Balding M.D.   On: 03/19/2016 17:22   Dg Chest Portable 1 View  Result Date: 03/06/2016 CLINICAL DATA:  Shortness of breath and productive cough. Seen last week by primary care physician but cough is worsening. EXAM: PORTABLE CHEST 1 VIEW COMPARISON:  11/30/2010 FINDINGS: Shallow inspiration with elevation of the right hemidiaphragm, similar to prior study. Borderline heart size with normal pulmonary vascularity. Calcification of the mitral valve annulus. Calcified and tortuous aorta. No focal airspace disease or consolidation in the lungs. No pneumothorax. Surgical clips in the base of the neck. Degenerative changes in the spine  and shoulders. IMPRESSION: Shallow inspiration with elevation of the right hemidiaphragm. No evidence of active pulmonary disease. Electronically Signed   By: Lucienne Capers M.D.   On: 03/06/2016 06:40   US Abdomen Limited Ruq  Result Date: 03/20/2016 CLINICAL DATA:  Elevated LFTs EXAM: US ABDOMEN LIMITED - RIGHT UPPER QUADRANT COMPARISON:  None. FINDINGS: Gallbladder: Layering small gallstones measuring up to 7 mm. Associated gallbladder sludge. No gallbladder wall thickening or pericholecystic fluid. Common bile duct: Diameter: 4 mm Liver: Poorly visualized.  No focal hepatic lesion is seen. IMPRESSION: Cholelithiasis, without associated sonographic findings to suggest acute cholecystitis. Electronically Signed   By: Julian Hy M.D.   On: 03/20/2016 09:37    Assessment:   Joy Hill is a 83 y.o. female with dementia, NH resident with second admission in 3 weeks for cough and sob and some AMS. She has had nml WBC, no fevers, BUN/cr > 30, CT chest shows some possible mild groud-glass findings. Flu B + as otpt. No evidence of aspiration. Has been on tamiflu and cefepime and vanco.  Discussed with daughter that this is unlikely bacterial process with above findings. Likely flu leading to dehydration and confusion.  Recommendations Check pro-calcitonin - if nml can dc abx and continue just tamiflu for 5 day course. Cont O2 - wean as tolerated Would try to get pt oob to chair to improve respiratory dynamics  Thank you very much for allowing me to participate in the care of this patient. Please call with questions.   Cheral Marker. Ola Spurr, MD

## 2016-03-20 NOTE — Progress Notes (Signed)
Initial Nutrition Assessment  DOCUMENTATION CODES:   Severe malnutrition in context of acute illness/injury, Obesity unspecified  INTERVENTION:  Provide Ensure Enlive po BID, each supplement provides 350 kcal and 20 grams of protein.  Provide Magic cup TID with meals, each supplement provides 290 kcal and 9 grams of protein.  NUTRITION DIAGNOSIS:   Malnutrition (Severe) related to acute illness, lethargy/confusion (influenza, cough) as evidenced by energy intake < or equal to 50% for > or equal to 5 days, 7.1 percent weight loss over 2 months.  GOAL:   Patient will meet greater than or equal to 90% of their needs  MONITOR:   PO intake, Supplement acceptance, Labs, I & O's, Weight trends  REASON FOR ASSESSMENT:   Malnutrition Screening Tool    ASSESSMENT:   83 year old female with PMHx of dementia, HTN, narcolepsy admitted with acute respiratory failure with hypoxia due to influenza, aortic stenosis, suspected mild CHF. Patient recently found to be flu positive, continues on Tamiflu.    Patient is confused and unable to participate in assessment. Spoke with patient's daughter at bedside who reports patient has been eating poorly for the past 2 weeks now. She will eat potatoes, her dessert (pudding or ice cream), but will not eat the rest of the food on her plate (no meat or vegetables). She reports the only symptom she is aware of is a cough.   Daughter reports patient's UBW used to be 245 lbs, but is unsure when she last weighed this. Per chart patient was 207 lbs on 01/04/2016. RD obtained bed scale weight of 192.3 lbs today. Patient has lost 14.7 lbs (7.1% body weight) over 2 months, which is significant for time frame.   Medications reviewed and include: acidophilus, methylprednisolone 60 mg Q6hrs, Tamiflu, vancomycin, NS @ 50 ml/hr.   Labs reviewed: Glucose 195, BUN 29.  Unable to complete Nutrition-Focused physical exam at this time. Patient is very confused and  frightened.   Discussed with RN.   Diet Order:  DIET - DYS 1 Room service appropriate? Yes with Assist; Fluid consistency: Thin  Skin:  Reviewed, no issues  Last BM:  03/19/2016  Height:   Ht Readings from Last 1 Encounters:  03/19/16 5\' 3"  (1.6 m)    Weight:   Wt Readings from Last 1 Encounters:  03/20/16 192 lb 4.8 oz (87.2 kg)    Ideal Body Weight:  52.3 kg  BMI:  Body mass index is 34.06 kg/m.  Estimated Nutritional Needs:   Kcal:  1460-1700 (MSJ x 1.2-1.4)  Protein:  80-95 grams (1-1.2 grams/kg)  Fluid:  1.5 L/day  EDUCATION NEEDS:   No education needs identified at this time  Helane RimaLeanne Niamya Vittitow, MS, RD, LDN Pager: [phone] After Hours Pager: [phone]

## 2016-03-20 NOTE — Progress Notes (Signed)
[facility] - [place] at [facility]   PATIENT NAME: Joy Hill    MR#:  [id number]  DATE OF BIRTH:  Jan 08, 1926  SUBJECTIVE:  CHIEF COMPLAINT:   Chief Complaint  Patient presents with  . Influenza  The patient is 84 year old Caucasian female with past medical history significant for history of dementia, recent admission for cough, shortness of breath, thought to be due to CHF, discharged to skilled nursing facility comes back to the hospital with recurrent shortness of breath, cough, wheezing , altered mental status, decreased oral intake. She tested flow be positive. The nursing facility and was initiated on Tamiflu about 4 days ago. She is unable to provide review of system, however, moans and groans, is coughing intermittently, dry cough  Review of Systems  Unable to perform ROS: Dementia    VITAL SIGNS: Blood pressure 120/73, pulse 87, temperature 97.8 F (36.6 C), resp. rate 20, height 5\' 3"  (1.6 m), weight 81.4 kg (179 lb 8 oz), SpO2 96 %.  PHYSICAL EXAMINATION:   GENERAL:  83 y.o.-year-old patient lying in the bed in moderate respiratory distress, dyspnea, uncomfortable, moaning, coughing, no sputum production.  EYES: Pupils equal, round, reactive to light and accommodation. No scleral icterus. Extraocular muscles intact.  HEENT: Head atraumatic, normocephalic. Oropharynx and nasopharynx clear.  NECK:  Supple, no jugular venous distention. No thyroid enlargement, no tenderness.  LUNGS: Normal breath sounds bilaterally, no wheezing, few basilar rales,rhonchi and crepitations noted posteriorly. Intermittent use of accessory muscles of respiration, with cough and movements.  CARDIOVASCULAR: S1, S2 is regular, distant, 4/6 systolic murmur was heard in aortic auscultation site with radiation to neck. No  rubs, or gallops.  ABDOMEN: Soft, nontender, nondistended. Bowel sounds present. No organomegaly or mass.  EXTREMITIES: Trace to 1+ lower extremity and  pedal edema, no cyanosis, or clubbing.  NEUROLOGIC: Cranial nerves II through XII are grossly intact. Muscle strength , difficult to examine due to poor compliance and shortness of breath . Sensation not able to assess due to dementia. Gait not checked.  PSYCHIATRIC: The patient is alert , not able to assess her orientation.  SKIN: No obvious rash, lesion, or ulcer. Significant sensitivity of the skin to touch of right foot, painful to palpation, patient withdraws  ORDERS/RESULTS REVIEWED:   CBC  Recent Labs Lab 03/19/16 1642 03/20/16 0502  WBC 5.1 4.6  HGB 14.9 14.4  HCT 45.1 43.4  PLT 306 307  MCV 95.3 95.1  MCH 31.4 31.4  MCHC 33.0 33.1  RDW 14.4 13.9  LYMPHSABS 1.7  --   MONOABS 0.6  --   EOSABS 0.0  --   BASOSABS 0.0  --    ------------------------------------------------------------------------------------------------------------------  Chemistries   Recent Labs Lab 03/19/16 1642 03/20/16 0502  NA 142 143  K 4.1 4.7  CL 104 106  CO2 30 28  GLUCOSE 108* 195*  BUN 30* 29*  CREATININE 0.97 0.79  CALCIUM 9.9 9.6  MG 1.9  --   AST 89*  --   ALT 79*  --   ALKPHOS 60  --   BILITOT 0.7  --    ------------------------------------------------------------------------------------------------------------------ estimated creatinine clearance is 49.1 mL/min (by C-G formula based on SCr of 0.79 mg/dL). ------------------------------------------------------------------------------------------------------------------  Recent Labs  03/19/16 1642  TSH 1.323    Cardiac Enzymes  Recent Labs Lab 03/19/16 1642 03/19/16 2331 03/20/16 0502  TROPONINI <0.03 <0.03 <0.03   ------------------------------------------------------------------------------------------------------------------ Invalid input(s): POCBNP ---------------------------------------------------------------------------------------------------------------  RADIOLOGY: Ct Angio Chest Pe W/cm &/or Wo  Cm  Result  Date: 03/19/2016 CLINICAL DATA:  Patient with cough and crackles.  Dementia. EXAM: CT ANGIOGRAPHY CHEST WITH CONTRAST TECHNIQUE: Multidetector CT imaging of the chest was performed using the standard protocol during bolus administration of intravenous contrast. Multiplanar CT image reconstructions and MIPs were obtained to evaluate the vascular anatomy. CONTRAST:  75 cc Isovue 370 COMPARISON:  Chest radiograph 03/19/2016 FINDINGS: Cardiovascular: The heart is enlarged. No pericardial effusion. Main pulmonary artery is dilated measuring 3.4 cm. Ascending thoracic aorta measures 4.3 cm. Aortic atherosclerosis. Motion artifact limits evaluation. No evidence for central or main pulmonary embolus. Evaluation of the lobar, segmental and subsegmental pulmonary arteries particularly within the right lung are nondiagnostic due to motion artifact. Mediastinum/Nodes: No enlarged axillary, mediastinal or hilar lymphadenopathy. Small hiatal hernia. Lungs/Pleura: Central airways are patent. Bandlike opacities within the lower lobes bilaterally. No large area pulmonary consolidation. No pleural effusion or pneumothorax. Upper Abdomen: Gallbladder is distended. Musculoskeletal: Thoracic spine degenerative changes. No aggressive or acute appearing osseous lesions. T12 and L1 vertebral body compression deformities, age indeterminate. Review of the MIP images confirms the above findings. IMPRESSION: Extensive motion artifact limits evaluation. No evidence for central or main pulmonary embolus. Evaluation of the lobar, segmental and subsegmental pulmonary arteries is nondiagnostic due to extensive motion artifact. Subpleural ground-glass and consolidative opacities within the lower lobes bilaterally favored to represent atelectasis. The gallbladder appears mildly distended. Recommend clinical correlation. Ultrasound can be performed as clinically indicated. Aortic atherosclerosis. Dilated main pulmonary artery as can be  seen with pulmonary arterial hypertension. Ascending thoracic aorta measures 4.3 cm. Recommend semi-annual imaging followup by CTA or MRA and referral to cardiothoracic surgery if not already obtained. This recommendation follows 2010 ACCF/AHA/AATS/ACR/ASA/SCA/SCAI/SIR/STS/SVM Guidelines for the Diagnosis and Management of Patients With Thoracic Aortic Disease. Circulation. 2010; 121: e266-e36 Age-indeterminate T12 and L1 compression deformities. Recommend correlation for point tenderness. Electronically Signed   By: Annia Beltrew  Davis M.D.   On: 03/19/2016 19:43   Dg Chest Portable 1 View  Result Date: 03/19/2016 CLINICAL DATA:  Acute onset of wet cough and bilateral lung crackles. Initial encounter. EXAM: PORTABLE CHEST 1 VIEW COMPARISON:  Chest radiograph performed 03/07/2016 FINDINGS: There is elevation of the right hemidiaphragm. The lungs are well-aerated. Mild peribronchial thickening is noted. There is no evidence of focal opacification, pleural effusion or pneumothorax. The cardiomediastinal silhouette is mildly enlarged. No acute osseous abnormalities are seen. Postoperative change is noted about the thyroid bed. IMPRESSION: Elevation of the right hemidiaphragm. Mild peribronchial thickening noted. Mild cardiomegaly. Electronically Signed   By: Roanna RaiderJeffery  Chang M.D.   On: 03/19/2016 17:22   [place] Abdomen Limited Ruq  Result Date: 03/20/2016 CLINICAL DATA:  Elevated LFTs EXAM: US ABDOMEN LIMITED - RIGHT UPPER QUADRANT COMPARISON:  None. FINDINGS: Gallbladder: Layering small gallstones measuring up to 7 mm. Associated gallbladder sludge. No gallbladder wall thickening or pericholecystic fluid. Common bile duct: Diameter: 4 mm Liver: Poorly visualized.  No focal hepatic lesion is seen. IMPRESSION: Cholelithiasis, without associated sonographic findings to suggest acute cholecystitis. Electronically Signed   By: Charline BillsSriyesh  Krishnan M.D.   On: 03/20/2016 09:37    EKG:  Orders placed or performed during the  hospital encounter of 03/19/16  . ED EKG  . ED EKG  . EKG 12-Lead    ASSESSMENT AND PLAN:  Active Problems:   Acute respiratory failure (HCC)  #1. Acute respiratory failure with hypoxia due to influenza as well as aortic stenosis, suspected mild CHF, patient was given 1 dose of Lasix intravenously today, continue oxygen therapy as needed #  2. Suspected pneumonia, checking pro-calcitonin, discontinue antibiotics if low, #3 influenza B, continue Tamiflu to complete course #4. Moderate to severe aortic stenosis, I discussed this patient is a daughter, her prognosis, which is poor, palliative care consultation is requested, patient's daughter is agreeable #5. A. fib, RVR, improved with conservative therapy #6. Lower extremity neuropathy, initiate patient on Lyrica   Management plans discussed with the patient, family and they are in agreement.   DRUG ALLERGIES: No Known Allergies  CODE STATUS:     Code Status Orders        Start     Ordered   03/19/16 2319  Do not attempt resuscitation (DNR)  Continuous    Question Answer Comment  In the event of cardiac or respiratory ARREST Do not call a "code blue"   In the event of cardiac or respiratory ARREST Do not perform Intubation, CPR, defibrillation or ACLS   In the event of cardiac or respiratory ARREST Use medication by any route, position, wound care, and other measures to relive pain and suffering. May use oxygen, suction and manual treatment of airway obstruction as needed for comfort.      03/19/16 2318    Code Status History    Date Active Date Inactive Code Status Order ID Comments User Context   03/19/2016 11:18 PM 03/19/2016 11:18 PM Full Code [id number]  [facility], DO Inpatient   03/06/2016  8:14 AM 03/09/2016  1:28 PM Full Code [id number]  Adrian Saran, MD ED   01/04/2016  5:05 PM 01/05/2016  4:53 PM Full Code [id number]  Altamese Dilling, MD Inpatient      TOTAL TIME TAKING CARE OF THIS PATIENT: 40 minutes.     Katharina Caper M.D on 03/20/2016 at 3:57 PM  Between 7am to 6pm - Pager - [phone]  After 6pm go to [email or web address] - password [email or web address] [facility]  [place] [facility]  Office  [phone]  CC: Primary care physician; No PCP Per Patient

## 2016-03-20 NOTE — Evaluation (Signed)
Physical Therapy Evaluation Patient Details Name: Joy Hill MRN: [id number] DOB: 12/13/1925 Today's Date: 03/20/2016   History of Present Illness  Pt is a 83 y.o. female with a known history of afib, dementia, hypertension, narcolepsy was in a usual state of health until two weeks ago she was admitted to the hospital with complaints of cough, SOB thought to be CHF.  She was discharged to skilled nursing on O2 and Rocephin after receiving Zosyn/Vanc inpatient but has become worse since then with SOB, cough, wheezing. Daughter is concerned that she is not improving.  She has progressively worsening symptoms and is now lethargic, decreased by mouth intake.    Clinical Impression  Pt presents with deficits in strength, transfers, mobility, gait, balance, and activity tolerance.  Pt required +2 max A with bed mobility tasks and was limited by agitation/cognitive status.  During static sitting at EOB pt required max to total assist to prevent posterior LOB.  Per pt's daughter pt's cognitive status is much lower currently that at baseline.  Pt will benefit from PT services to address above deficits for decreased caregiver assistance upon discharge.      Follow Up Recommendations SNF    Equipment Recommendations  None recommended by PT    Recommendations for Other Services       Precautions / Restrictions Precautions Precautions: Fall Restrictions Weight Bearing Restrictions: No      Mobility  Bed Mobility Overal bed mobility: Needs Assistance Bed Mobility: Supine to Sit;Sit to Supine     Supine to sit: Max assist;+2 for physical assistance Sit to supine: Max assist;+2 for physical assistance   General bed mobility comments: Max verbal and tactile cues for bed mobility tasks with pt providing little to no effort, close to total assist  Transfers                 General transfer comment: Unable/unsafe to attempt  Ambulation/Gait             General Gait Details:  Unable/unsafe to attempt  Stairs            Wheelchair Mobility    Modified Rankin (Stroke Patients Only)       Balance   Sitting-balance support: Bilateral upper extremity supported Sitting balance-Leahy Scale: Zero Sitting balance - Comments: Pt required max to total assist to maintain sitting balance at EOB       Standing balance comment: Unable/unsafe to attempt                             Pertinent Vitals/Pain Pain Assessment:  (Unable to assess)    Home Living Family/patient expects to be discharged to:: Assisted living               Home Equipment: Walker - 2 wheels Additional Comments: [facility]; history obtained from daughter secondary to pt's cognitive status    Prior Function Level of Independence: Needs assistance   Gait / Transfers Assistance Needed: Pt walks short distances with RW Mod I but uses w/c for longer distances (gets pushed to dining hall).  ADL's / Homemaking Assistance Needed: Assist for meals, medications, dressing, showers.  Comments: Per pt's daughter pt has not fallen for over 4 years     Hand Dominance   Dominant Hand: Right    Extremity/Trunk Assessment   Upper Extremity Assessment Upper Extremity Assessment: Difficult to assess due to impaired cognition    Lower Extremity Assessment Lower Extremity Assessment:  Difficult to assess due to impaired cognition       Communication   Communication: HOH  Cognition Arousal/Alertness: Lethargic Behavior During Therapy: Agitated;Anxious Overall Cognitive Status: Impaired/Different from baseline Area of Impairment: Orientation;Attention;Following commands;Awareness Orientation Level: Disoriented to;Person;Place;Time;Situation             General Comments: Pt's daughter present entire PT session and assisted with history    General Comments      Exercises     Assessment/Plan    PT Assessment Patient needs continued PT services  PT Problem  List Decreased strength;Decreased activity tolerance;Decreased balance;Decreased mobility       PT Treatment Interventions DME instruction;Gait training;Functional mobility training;Therapeutic activities;Therapeutic exercise;Balance training;Neuromuscular re-education;Patient/family education    PT Goals (Current goals can be found in the Care Plan section)  Acute Rehab PT Goals PT Goal Formulation: Patient unable to participate in goal setting Time For Goal Achievement: 04/02/16 Potential to Achieve Goals: Fair    Frequency Min 2X/week   Barriers to discharge        Co-evaluation               End of Session Equipment Utilized During Treatment: Oxygen Activity Tolerance: Treatment limited secondary to agitation Patient left: in bed;with bed alarm set;with call bell/phone within reach;with family/visitor present Nurse Communication: Mobility status PT Visit Diagnosis: Difficulty in walking, not elsewhere classified (R26.2);Muscle weakness (generalized) (M62.81)         Time: 1610-9604 PT Time Calculation (min) (ACUTE ONLY): 26 min   Charges:   PT Evaluation $PT Eval Low Complexity: 1 Procedure     PT G Codes:         DElly Modena PT, DPT 03/20/16, 11:14 AM

## 2016-03-20 NOTE — Evaluation (Signed)
Clinical/Bedside Swallow Evaluation Patient Details  Name: Joy DaviesCarrie Hill MRN: [id number] Date of Birth: 04/04/1925  Today's Date: 03/20/2016 Time: SLP Start Time (ACUTE ONLY): 0850 SLP Stop Time (ACUTE ONLY): 0950 SLP Time Calculation (min) (ACUTE ONLY): 60 min  Past Medical History:  Past Medical History:  Diagnosis Date  . Atrial fibrillation (HCC)    seen on multiple prior EKGs  . Dementia   . Hypertension   . Narcolepsy    Past Surgical History: History reviewed. No pertinent surgical history. HPI:  Pt is a 83 y.o. female with a known history of afib, severe Dementia, hypertension, narcolepsy was in a usual state of health until two weeks ago she was admitted to the hospital with complaints of cough, SOB thought to be CHF.  She was discharged to skilled nursing on O2 and Rocephin after receiving Zosyn/Vanc inpatient but has become worse since then with SOB, cough, wheezing and was readmitted to the hospital. Daughter is concerned that she is not improving. Of note she tested positive for Flu B at her nursing facility four days ago and is being treated with Tamiflu. Pt is confused and crying, calling out. Daughter assisted in talking to pt to help calm her and provided her w/ her favorite stuffed animal for comfort. Prior to the first of the month when she became sick, Daughter stated pt "ate well and all the time".    Assessment / Plan / Recommendation Clinical Impression  Pt appears at min increased risk for aspiration secondary to her declined Cognitive status; distraction and tearfulness. When calm, pt was able to help hold the cup for drinking and drank several ozs of thin liquids via cup/straw - noted min impulsivity requiring monitoring of drinking to slow down as needed to avoid coughing/choking. Pt accepted a few trials of puree(something sweet). No trials of solids given secondary to her agitated Cognitive status and tearfulness and d/t recommendation to not place dentures at this  time(Cognitive status). Recommended a Dysphagia level 1 (puree) diet w/ thin liquids; general aspiration precautions; assistance w/ feeding at meals and reducing environmental stimuli during meals to allow pt to focus on eating/drinking. Recommend Pills be given in Puree - crushed as needed but then given in ice cream(?). Recommend f/u once pt is in a more structured, calm environment for trials to upgrade food consistency in her diet and further education w/ family.  SLP Visit Diagnosis: Dysphagia, oropharyngeal phase (R13.12)    Aspiration Risk  Mild aspiration risk (but reduced following general aspiration precautions)    Diet Recommendation  Dysphagia level 1 (puree) w/ thin liquids; general aspiration precautions; assistance at meals.   Medication Administration: Crushed with puree    Other  Recommendations Recommended Consults:  (Dietician consulted) Oral Care Recommendations: Oral care BID;Staff/trained caregiver to provide oral care   Follow up Recommendations None (TBD)      Frequency and Duration min 3x week  2 weeks       Prognosis Prognosis for Safe Diet Advancement: Fair Barriers to Reach Goals: Cognitive deficits;Severity of deficits      Swallow Study   General Date of Onset: 03/19/16 HPI: Pt is a 83 y.o. female with a known history of afib, severe Dementia, hypertension, narcolepsy was in a usual state of health until two weeks ago she was admitted to the hospital with complaints of cough, SOB thought to be CHF.  She was discharged to skilled nursing on O2 and Rocephin after receiving Zosyn/Vanc inpatient but has become worse since then with  SOB, cough, wheezing and was readmitted to the hospital. Daughter is concerned that she is not improving. Of note she tested positive for Flu B at her nursing facility four days ago and is being treated with Tamiflu. Pt is confused and crying, calling out. Daughter assisted in talking to pt to help calm her and provided her w/ her  favorite stuffed animal for comfort. Prior to the first of the month when she became sick, Daughter stated pt "ate well and all the time".  Type of Study: Bedside Swallow Evaluation Previous Swallow Assessment: none Diet Prior to this Study: Regular;Thin liquids Temperature Spikes Noted: No (wbc not elevated) Respiratory Status: Nasal cannula (2 liters) History of Recent Intubation: No Behavior/Cognition: Alert;Confused;Agitated;Distractible;Requires cueing (crying) Oral Cavity Assessment: Dry (difficult to assess d/t Cognitive status) Oral Care Completed by SLP: Recent completion by staff Oral Cavity - Dentition: Dentures, top;Dentures, bottom (not wearing them at this time) Vision: Functional for self-feeding Self-Feeding Abilities: Able to feed self;Needs assist;Needs set up;Total assist (can hold cup to drink) Patient Positioning: Upright in bed Baseline Vocal Quality: Normal Volitional Cough: Cognitively unable to elicit Volitional Swallow: Unable to elicit    Oral/Motor/Sensory Function Overall Oral Motor/Sensory Function: Within functional limits (w/ bolus management)   Ice Chips Ice chips: Not tested   Thin Liquid Thin Liquid: Within functional limits Presentation: Cup;Self Fed;Straw (supported by SLP; multiple sips, single sips) Other Comments: min impulsive    Nectar Thick Nectar Thick Liquid: Not tested   Honey Thick Honey Thick Liquid: Not tested   Puree Puree: Within functional limits [employer] trials) Presentation: Spoon (fed; 4 trials ) Other Comments: became tearful   Solid   GO   Solid: Not tested Other Comments: not wearing her dentures currently. Of note, she expectorated the applesauce w/ crushed pills bolus stating "I just can't swallow that".          Jerilynn Som, MS, CCC-SLP Watson,Katherine 03/20/2016,5:21 PM

## 2016-03-20 NOTE — Plan of Care (Signed)
Problem: Spiritual Needs Goal: Ability to function at adequate level Outcome: Not Progressing Confused

## 2016-03-20 NOTE — Progress Notes (Signed)
CH responded to an OR for an AD. Daughter was bedside. Pt presented upset and was not cognizant. CH assessed that an AD could not be educated or completed at this time. Daughter understood. CH is available for follow up if Pt returns to some resemblance of baseline.    03/20/16 1100  Clinical Encounter Type  Visited With Patient;Patient and family together  Visit Type Initial;Spiritual support  Referral From Nurse  Spiritual Encounters  Spiritual Needs Brochure

## 2016-03-20 NOTE — Progress Notes (Signed)
Pharmacy Antibiotic Note  Joy Hill is a 83 y.o. female admitted on 03/19/2016 with PNA.  Pharmacy has been consulted for cefepime and vancomycin dosing.  Plan: 1. Cefepime 2 gm IV Q24H 2. Vancomycin 1000 mg IV x 1 followed in approximately 18 hours (stacked dosing) by vancomycin 750 mg Q24H, predicted trough 15 mcg/mL. Pharmacy will continue to follow and adjust as needed to maintain trough 15 to 20 mcg/mL.   Vd 36.3 L, ke 0.038 hr-1, T1/2 18.3 hr  Height: 5\' 3"  (160 cm) Weight: 179 lb 8 oz (81.4 kg) IBW/kg (Calculated) : 52.4  Temp (24hrs), Avg:98.5 F (36.9 C), Min:98.2 F (36.8 C), Max:98.7 F (37.1 C)   Recent Labs Lab 03/19/16 1642 03/19/16 1643  WBC 5.1  --   CREATININE 0.97  --   LATICACIDVEN  --  1.6    Estimated Creatinine Clearance: 40.5 mL/min (by C-G formula based on SCr of 0.97 mg/dL).    No Known Allergies  Thank you for allowing pharmacy to be a part of this patient's care.  Carola FrostNathan A Talley Casco, Pharm.D., BCPS Clinical Pharmacist 03/20/2016 2:01 AM

## 2016-03-20 NOTE — [place] FL2 (Signed)
[place] MEDICAID FL2 LEVEL OF CARE SCREENING TOOL     IDENTIFICATION  Patient Name: Joy Hill Birthdate: 04/24/1925 Sex: female Admission Date (Current Location): 03/19/2016  [place]   Facility and Address:  [facility], [address], [place], [place] [address]      Provider Number: [id number]  Attending Physician Name and Address:  Katharina Caperima Leelynd Maldonado, MD  Relative Name and Phone Number:       Current Level of Care: Hospital Recommended Level of Care: Skilled Nursing Facility Prior Approval Number:    Date Approved/Denied:   PASRR Number: [id number]  Discharge Plan: SNF    Current Diagnoses: Patient Active Problem List   Diagnosis Date Noted  . Acute respiratory failure (HCC) 03/19/2016  . Respiratory failure (HCC) 03/06/2016  . Syncopal episodes 01/04/2016  . Syncope 01/04/2016    Orientation RESPIRATION BLADDER Height & Weight     Self  O2 (2L) Incontinent Weight: 179 lb 8 oz (81.4 kg) Height:  5\' 3"  (160 cm)  BEHAVIORAL SYMPTOMS/MOOD NEUROLOGICAL BOWEL NUTRITION STATUS      Incontinent Diet (DYS 1, Thin Liquids)  AMBULATORY STATUS COMMUNICATION OF NEEDS Skin   Extensive Assist Verbally Normal                       Personal Care Assistance Level of Assistance  Bathing, Feeding, Dressing Bathing Assistance: Maximum assistance Feeding assistance: Limited assistance Dressing Assistance: Maximum assistance     Functional Limitations Info  Sight, Hearing, Speech Sight Info: Adequate Hearing Info: Adequate Speech Info: Adequate    SPECIAL CARE FACTORS FREQUENCY  PT (By licensed PT), Speech therapy     PT Frequency: 5       Speech Therapy Frequency: 5      Contractures Contractures Info: Not present    Additional Factors Info  Code Status, Allergies, Isolation Precautions Code Status Info: DNR Allergies Info: No known allergies     Isolation Precautions Info: Droplet      Current Medications (03/20/2016):  This is the current hospital active medication list Current Facility-Administered Medications  Medication Dose Route Frequency Provider Last Rate Last Dose  . acetaminophen (TYLENOL) tablet 650 mg  650 mg Oral Q6H PRN Alexis Hugelmeyer, DO       Or  . acetaminophen (TYLENOL) suppository 650 mg  650 mg Rectal Q6H PRN Alexis Hugelmeyer, DO      . acidophilus (RISAQUAD) capsule 2 capsule  2 capsule Oral TID Alexis Hugelmeyer, DO   2 capsule at 03/20/16 0951  . albuterol (PROVENTIL) (2.5 MG/3ML) 0.083% nebulizer solution 2.5 mg  2.5 mg Nebulization Q6H PRN Alexis Hugelmeyer, DO      . aspirin EC tablet 81 mg  81 mg Oral Daily Alexis Hugelmeyer, DO   81 mg at 03/20/16 0951  . bisacodyl (DULCOLAX) EC tablet 5 mg  5 mg Oral Daily PRN Alexis Hugelmeyer, DO      . ceFEPIme (MAXIPIME) 2 g in dextrose 5 % 50 mL IVPB  2 g Intravenous Q24H Alexis Hugelmeyer, DO   2 g at 03/20/16 0230  . dextroamphetamine (DEXEDRINE SPANSULE) 24 hr capsule 10 mg  10 mg Oral Daily Alexis Hugelmeyer, DO   10 mg at 03/20/16 0951  . enoxaparin (LOVENOX) injection 40 mg  40 mg Subcutaneous QHS Alexis Hugelmeyer, DO   40 mg at 03/19/16 2348  . fluticasone (FLONASE) 50 MCG/ACT nasal spray 2 spray  2 spray Each Nare Daily Alexis Hugelmeyer, DO      .  guaiFENesin (MUCINEX) 12 hr tablet 600 mg  600 mg Oral BID PRN Alexis Hugelmeyer, DO      . ipratropium (ATROVENT) nebulizer solution 0.5 mg  0.5 mg Nebulization Q6H PRN Alexis Hugelmeyer, DO      . magnesium citrate solution 1 Bottle  1 Bottle Oral Once PRN Alexis Hugelmeyer, DO      . methylPREDNISolone sodium succinate (SOLU-MEDROL) 125 mg/2 mL injection 60 mg  60 mg Intravenous Q6H Alexis Hugelmeyer, DO   60 mg at 03/20/16 4540  . nystatin (MYCOSTATIN/NYSTOP) topical powder   Topical BID Alexis Hugelmeyer, DO      . ondansetron (ZOFRAN) tablet 4 mg  4 mg Oral Q6H PRN Alexis Hugelmeyer, DO       Or  . ondansetron (ZOFRAN) injection 4 mg  4 mg  Intravenous Q6H PRN Alexis Hugelmeyer, DO      . oseltamivir (TAMIFLU) capsule 75 mg  75 mg Oral BID Alexis Hugelmeyer, DO   75 mg at 03/20/16 0951  . oxyCODONE (Oxy IR/ROXICODONE) immediate release tablet 5 mg  5 mg Oral Q4H PRN Alexis Hugelmeyer, DO      . pregabalin (LYRICA) capsule 25 mg  25 mg Oral BID Katharina Caper, MD   25 mg at 03/20/16 0951  . senna-docusate (Senokot-S) tablet 1 tablet  1 tablet Oral QHS PRN Alexis Hugelmeyer, DO      . sodium chloride flush (NS) 0.9 % injection 3 mL  3 mL Intravenous Q12H Alexis Hugelmeyer, DO   3 mL at 03/20/16 0953  . vancomycin (VANCOCIN) IVPB 750 mg/150 ml premix  750 mg Intravenous Q24H Alexis Hugelmeyer, DO         Discharge Medications: Please see discharge summary for a list of discharge medications.  Relevant Imaging Results:  Relevant Lab Results:   Additional Information SSN:  [id number]  Dede Query, LCSW

## 2016-03-20 NOTE — Progress Notes (Signed)
ID FU Procalc < 0.1 Stopped abx. Contine tamiflu Her confusion and somnolence likely multifactorial due to dehydration and deliurum

## 2016-03-21 DIAGNOSIS — F0280 Dementia in other diseases classified elsewhere without behavioral disturbance: Secondary | ICD-10-CM

## 2016-03-21 DIAGNOSIS — G3183 Dementia with Lewy bodies: Secondary | ICD-10-CM

## 2016-03-21 DIAGNOSIS — J101 Influenza due to other identified influenza virus with other respiratory manifestations: Secondary | ICD-10-CM

## 2016-03-21 DIAGNOSIS — I350 Nonrheumatic aortic (valve) stenosis: Secondary | ICD-10-CM

## 2016-03-21 DIAGNOSIS — Z7189 Other specified counseling: Secondary | ICD-10-CM

## 2016-03-21 DIAGNOSIS — J9601 Acute respiratory failure with hypoxia: Secondary | ICD-10-CM

## 2016-03-21 DIAGNOSIS — Z515 Encounter for palliative care: Secondary | ICD-10-CM

## 2016-03-21 DIAGNOSIS — G629 Polyneuropathy, unspecified: Secondary | ICD-10-CM

## 2016-03-21 DIAGNOSIS — I4891 Unspecified atrial fibrillation: Secondary | ICD-10-CM

## 2016-03-21 MED ORDER — ENSURE ENLIVE PO LIQD: 237.0000 mL | Freq: Two times a day (BID) | ORAL | 12 refills | Status: AC

## 2016-03-21 MED ORDER — OSELTAMIVIR PHOSPHATE 30 MG PO CAPS: 30.0000 mg | ORAL_CAPSULE | Freq: Two times a day (BID) | ORAL | 0 refills | Status: AC

## 2016-03-21 MED ORDER — FUROSEMIDE 20 MG PO TABS: 20.0000 mg | ORAL_TABLET | Freq: Every day | ORAL | 6 refills | Status: AC | PRN

## 2016-03-21 MED ORDER — PREGABALIN 25 MG PO CAPS: 25.0000 mg | ORAL_CAPSULE | Freq: Two times a day (BID) | ORAL | 6 refills | Status: AC

## 2016-03-21 NOTE — Clinical Social Work Note (Signed)
Clinical Social Work Assessment  Patient Details  Name: Joy Hill MRN: [id number] Date of Birth: [birth year]  Date of referral:  03/21/16               Reason for consult:  Facility Placement                Permission sought to share information with:  Family Supports Permission granted to share information::  Yes, Verbal Permission Granted  Name::     [place]::     Relationship::  daughter  Contact Information:  [phone]  Housing/Transportation Living arrangements for the past 2 months:  [place], [place] of Information:  Adult Children Patient Interpreter Needed:  None Criminal Activity/Legal Involvement Pertinent to Current Situation/Hospitalization:  No - Comment as needed Significant Relationships:  Adult Children Lives with:  Facility Resident Do you feel safe going back to the place where you live?  No Need for family participation in patient care:  Yes (Comment)  Care giving concerns:  Pt will be returnin to [facility] with Palliative Care NP [facility] throu[facility].   Social Worker assessment / plan:  CSW met with pt's daughter to address consult for returning to [facility]. Pt was sleeping during assessment and is not oriented. Pt is a STR resident. It has been recommending that Palliative Care follow at the facility. CSW introduced herself and explained role of social work. CSW also explained the process of discharging and returning to SNF. CSW provided supportive counseling around pt's prognosis.   Pt is ready for discharge today and will return to facility as facility as received discharge information. RN will call report. [facility] will provide [facility]. Pt's daughter is aware and agreeable to discharge plan. CSW is signing off as no further needs identified.   Employment status:  Retired Nurse, adult PT  Recommendations:  [place] / Referral to community resources:  Other (Comment Required) ([place] and Palliative Care NP with [facility]. )  Patient/Family's Response to care:  Pt's daughter was appreciative of CSW support.   Patient/Family's Understanding of and Emotional Response to Diagnosis, Current Treatment, and Prognosis:  Pt's daughter understands that pt would benefit from Palliative Care in support of her STR.   Emotional Assessment Appearance:  Appears older than stated age Attitude/Demeanor/Rapport:  Other (Sleeping ) Affect (typically observed):  Other (Sleeping ) Orientation:  Oriented to Self Alcohol / Substance use:  Not Applicable Psych involvement (Current and /or in the community):  No (Comment)  Discharge Needs  Concerns to be addressed:  Adjustment to Illness Readmission within the last 30 days:  No Current discharge risk:  Chronically ill Barriers to Discharge:  No Barriers Identified   Darden Dates, LCSW 03/21/2016, 3:58 PM

## 2016-03-21 NOTE — Discharge Summary (Addendum)
[facility] [place] [facility]   PATIENT NAME: Joy DaviesCarrie Hill    MR#:  [id number]  DATE OF BIRTH:  03/06/1925  DATE OF ADMISSION:  03/19/2016 ADMITTING PHYSICIAN: Jon GillsAlexis Hugelmeyer, DO  DATE OF DISCHARGE: No discharge date for patient encounter.  PRIMARY CARE PHYSICIAN: No PCP Per Patient     ADMISSION DIAGNOSIS:  Tachypnea [R06.82] Bronchospasm [J98.01] Respiratory distress [R06.00] Atrial fibrillation with rapid ventricular response (HCC) [I48.91]  DISCHARGE DIAGNOSIS:  Principal Problem:   Acute respiratory failure with hypoxia (HCC) Active Problems:   Severe aortic stenosis   Influenza B   Atrial fibrillation with RVR (HCC)   Neuropathy (HCC)   SECONDARY DIAGNOSIS:   Past Medical History:  Diagnosis Date  . Atrial fibrillation (HCC)    seen on multiple prior EKGs  . Dementia   . Hypertension   . Narcolepsy     .pro HOSPITAL COURSE:  The patient is 83 year old Caucasian female with past medical history significant for history of dementia, recent admission for cough, shortness of breath, thought to be due to CHF, discharged to skilled nursing facility comes back to the hospital with recurrent shortness of breath, cough, wheezing , altered mental status, decreased oral intake. She tested positive for influenza in the nursing facility and was initiated on Tamiflu about 4 days ago. n arrival to emergency room, she was tachypneic with respiratory rate of 32, tachycardic, atrial fibrillation, hypoxic, requiring 2 L of oxygen, portable chest x-ray revealed elevation of right hemidiaphragm,mild cardiomegaly, mild peribronchial thickening. CT angiogram of the chest revealed atelectasis, but no pneumonia, it was uninformative for PE, however, pulmonary arteries were enlarged, consistent with hypertension,moreover, gallbladder was distended. Patient had ultrasound of abdomen, limited the right upper quadrant, revealing cholelithiasis, but no acute  cholecystitis, common bile duct was 4 mm, normal. Patient was asymptomatic. Patient was seen by infectious disease specialist, Dr. Sampson GoonFitzgerald, ordered pro-calcitonin level, which was low at less than 0.1, revealing unlikely pneumonia. Antibiotics were stopped. Patient was managed symptomatically for influenza infection, she was also felt to be in congestive heart failure,acute diastolic CHF due to moderate to severe aortic stenosis. Lasix was initiated with improvement of respiratory status. Patient was seen by palliative care, follow-up with palliative care in skilled nursing facility was recommended. Discussion by problem: #1. Acute respiratory failure with hypoxia due to influenza as well as cute diastolic CHF due to moderate aortic stenosis, improved with Lasix, patient is to continue Lasix, Tamiflu, oxygen therapy,oxygen saturation was 98% on 3 L of oxygen through nasal cannula on the day of discharge. #2. Suspected pneumonia, pro-calcitonin was low, antibiotics were discontinued by infectious disease specialist, Dr. Sampson GoonFitzgerald, patient remained afebrile, white blood cell count remains stable. #3. Acute diastolic CHF due to moderate to severe aortic stenosis, patient is to continue Lasix, watching her kidney function, oral intake, weight closely.  #4 influenza B, continue Tamiflu for 3 more days to complete course.  #5. Moderate to severe aortic stenosis, I discussed with the patient and her daughter, prognosis overall is poor, palliative care to follow-up with patient and her family in skilled nursing facility #6. A. fib, RVR, improved with conservative therapy, the patient may need to be initiated on Cardizem CD, if he remains tachycardic, depending on blood pressure readings #7. Lower extremity neuropathy, initiate patient on Lyrica, improved clinically, follow cloly #8 Malnutrition (Severe) related to acute illness, lethargy/confusion (influenza, cough) as evidenced by energy intake < or equal to  50% for > or equal to 5 days,  7.1 percent weight loss over 2 months. BMI: Body mass index is 34.06 kg/m.", dietary to supplement with ensure and magic cup DISCHARGE CONDITIONS:   guarded  CONSULTS OBTAINED:  Treatment Team:  Jeralyn Ruths, MD Mick Sell, MD  DRUG ALLERGIES:  No Known Allergies  DISCHARGE MEDICATIONS:   Current Discharge Medication List    START taking these medications   Details  feeding supplement, ENSURE ENLIVE, (ENSURE ENLIVE) LIQD Take 237 mLs by mouth 2 (two) times daily between meals. Qty: 237 mL, Refills: 12    furosemide (LASIX) 20 MG tablet Take 1 tablet (20 mg total) by mouth daily as needed. Qty: 30 tablet, Refills: 6    pregabalin (LYRICA) 25 MG capsule Take 1 capsule (25 mg total) by mouth 2 (two) times daily. Qty: 60 capsule, Refills: 6      CONTINUE these medications which have CHANGED   Details  oseltamivir (TAMIFLU) 30 MG capsule Take 1 capsule (30 mg total) by mouth 2 (two) times daily. Qty: 6 capsule, Refills: 0      CONTINUE these medications which have NOT CHANGED   Details  acetaminophen (TYLENOL) 500 MG tablet Take 500 mg by mouth daily.    albuterol (PROVENTIL HFA;VENTOLIN HFA) 108 (90 Base) MCG/ACT inhaler Inhale 2 puffs into the lungs every 4 (four) hours as needed for shortness of breath.     aspirin EC 81 MG tablet Take by mouth daily.     citalopram (CELEXA) 10 MG tablet Take 10 mg by mouth daily.    dextroamphetamine (DEXEDRINE SPANSULE) 10 MG 24 hr capsule Take 10 mg by mouth daily.    fluticasone (FLONASE) 50 MCG/ACT nasal spray Place 2 sprays into both nostrils daily.    guaiFENesin (MUCINEX) 600 MG 12 hr tablet Take 600 mg by mouth every 12 (twelve) hours as needed for cough or congestion.    ipratropium-albuterol (DUONEB) 0.5-2.5 (3) MG/3ML SOLN Take 3 mLs by nebulization every 4 (four) hours as needed.    Lactobacillus (PROBIOTIC ACIDOPHILUS) TABS Take 1 tablet by mouth 2 (two) times daily.     nystatin (MYCOSTATIN/NYSTOP) powder Apply topically 2 (two) times daily. Apply under breast twice daily for redness.    senna-docusate (SENOKOT-S) 8.6-50 MG tablet Take 1 tablet by mouth at bedtime as needed for mild constipation.         DISCHARGE INSTRUCTIONS:    The patient is to follow-up with primary care physician as outpatient  If you experience worsening of your admission symptoms, develop shortness of breath, life threatening emergency, suicidal or homicidal thoughts you must seek medical attention immediately by calling 911 or calling your MD immediately  if symptoms less severe.  You Must read complete instructions/literature along with all the possible adverse reactions/side effects for all the Medicines you take and that have been prescribed to you. Take any new Medicines after you have completely understood and accept all the possible adverse reactions/side effects.   Please note  You were cared for by a hospitalist during your hospital stay. If you have any questions about your discharge medications or the care you received while you were in the hospital after you are discharged, you can call the unit and asked to speak with the hospitalist on call if the hospitalist that took care of you is not available. Once you are discharged, your primary care physician will handle any further medical issues. Please note that NO REFILLS for any discharge medications will be authorized once you are discharged, as it is  imperative that you return to your primary care physician (or establish a relationship with a primary care physician if you do not have one) for your aftercare needs so that they can reassess your need for medications and monitor your lab values.    Today   CHIEF COMPLAINT:   Chief Complaint  Patient presents with  . Influenza    HISTORY OF PRESENT ILLNESS:  Joy Hill  is a 83 y.o. female with a known history of dementia, recent admission for cough, shortness  of breath, thought to be due to CHF, discharged to skilled nursing facility comes back to the hospital with recurrent shortness of breath, cough, wheezing , altered mental status, decreased oral intake. She tested positive for influenza in the nursing facility and was initiated on Tamiflu about 4 days ago. n arrival to emergency room, she was tachypneic with respiratory rate of 32, tachycardic, atrial fibrillation, hypoxic, requiring 2 L of oxygen, portable chest x-ray revealed elevation of right hemidiaphragm,mild cardiomegaly, mild peribronchial thickening. CT angiogram of the chest revealed atelectasis, but no pneumonia, it was uninformative for PE, however, pulmonary arteries were enlarged, consistent with hypertension,moreover, gallbladder was distended. Patient had ultrasound of abdomen, limited the right upper quadrant, revealing cholelithiasis, but no acute cholecystitis, common bile duct was 4 mm, normal. Patient was asymptomatic. Patient was seen by infectious disease specialist, Dr. Sampson Goon, ordered pro-calcitonin level, which was low at less than 0.1, revealing unlikely pneumonia. Antibiotics were stopped. Patient was managed symptomatically for influenza infection, she was also felt to be in congestive heart failure,acute diastolic CHF due to moderate to severe aortic stenosis. Lasix was initiated with improvement of respiratory status. Patient was seen by palliative care, follow-up with palliative care in skilled nursing facility was recommended. Discussion by problem: #1. Acute respiratory failure with hypoxia due to influenza as well as cute diastolic CHF due to moderate aortic stenosis, improved with Lasix, patient is to continue Lasix, Tamiflu, oxygen therapy,oxygen saturation was 98% on 3 L of oxygen through nasal cannula on the day of discharge. #2. Suspected pneumonia, pro-calcitonin was low, antibiotics were discontinued by infectious disease specialist, Dr. Sampson Goon, patient remained  afebrile, white blood cell count remains stable. #3. Acute diastolic CHF due to moderate to severe aortic stenosis, patient is to continue Lasix, watching her kidney function, oral intake, weight closely.  #4 influenza B, continue Tamiflu for 3 more days to complete course.  #5. Moderate to severe aortic stenosis, I discussed with the patient and her daughter, prognosis overall is poor, palliative care to follow-up with patient and her family in skilled nursing facility #6. A. fib, RVR, improved with conservative therapy, the patient may need to be initiated on Cardizem CD, if he remains tachycardic, depending on blood pressure readings #7. Lower extremity neuropathy, initiate patient on Lyrica, improved clinically, follow cloly #8 Malnutrition (Severe) related to acute illness, lethargy/confusion (influenza, cough) as evidenced by energy intake < or equal to 50% for > or equal to 5 days, 7.1 percent weight loss over 2 months. BMI: Body mass index is 34.06 kg/m.", dietary to supplement with ensure and magic cup VITAL SIGNS:  Blood pressure 127/82, pulse 94, temperature 97.7 F (36.5 C), temperature source Oral, resp. rate 17, height 5\' 3"  (1.6 m), weight 87.2 kg (192 lb 4.8 oz), SpO2 98 %.  I/O:  No intake or output data in the 24 hours ending 03/21/16 1158  PHYSICAL EXAMINATION:  GENERAL:  83 y.o.-year-old patient lying in the bed with no acute distress.  EYES:  Pupils equal, round, reactive to light and accommodation. No scleral icterus. Extraocular muscles intact.  HEENT: Head atraumatic, normocephalic. Oropharynx and nasopharynx clear.  NECK:  Supple, no jugular venous distention. No thyroid enlargement, no tenderness.  LUNGS: Normal breath sounds bilaterally, no wheezing, rales,rhonchi or crepitation. No use of accessory muscles of respiration.  CARDIOVASCULAR: S1, S2 rregularly irregular, 4/6 systolic murmur in aortic auscultation side. No  rubs, or gallops.  ABDOMEN: Soft, non-tender,  non-distended. Bowel sounds present. No organomegaly or mass.  EXTREMITIES: No pedal edema, cyanosis, or clubbing.  NEUROLOGIC: Cranial nerves II through XII are intact. Muscle strength 5/5 in all extremities. Sensation intact. Gait not checked.  PSYCHIATRIC: The patient is alert  SKIN: No obvious rash, lesion, or ulcer.   DATA REVIEW:   CBC  Recent Labs Lab 03/20/16 0502  WBC 4.6  HGB 14.4  HCT 43.4  PLT 307    Chemistries   Recent Labs Lab 03/19/16 1642 03/20/16 0502  NA 142 143  K 4.1 4.7  CL 104 106  CO2 30 28  GLUCOSE 108* 195*  BUN 30* 29*  CREATININE 0.97 0.79  CALCIUM 9.9 9.6  MG 1.9  --   AST 89*  --   ALT 79*  --   ALKPHOS 60  --   BILITOT 0.7  --     Cardiac Enzymes  Recent Labs Lab 03/20/16 0502  TROPONINI <0.03    Microbiology Results  Results for orders placed or performed during the hospital encounter of 03/19/16  MRSA PCR Screening     Status: None   Collection Time: 03/19/16 11:32 PM  Result Value Ref Range Status   MRSA by PCR NEGATIVE NEGATIVE Final    Comment:        The GeneXpert MRSA Assay (FDA approved for NASAL specimens only), is one component of a comprehensive MRSA colonization surveillance program. It is not intended to diagnose MRSA infection nor to guide or monitor treatment for MRSA infections.     RADIOLOGY:  Ct Angio Chest Pe W/cm &/or Wo Cm  Result Date: 03/19/2016 CLINICAL DATA:  Patient with cough and crackles.  Dementia. EXAM: CT ANGIOGRAPHY CHEST WITH CONTRAST TECHNIQUE: Multidetector CT imaging of the chest was performed using the standard protocol during bolus administration of intravenous contrast. Multiplanar CT image reconstructions and MIPs were obtained to evaluate the vascular anatomy. CONTRAST:  75 cc Isovue 370 COMPARISON:  Chest radiograph 03/19/2016 FINDINGS: Cardiovascular: The heart is enlarged. No pericardial effusion. Main pulmonary artery is dilated measuring 3.4 cm. Ascending thoracic aorta  measures 4.3 cm. Aortic atherosclerosis. Motion artifact limits evaluation. No evidence for central or main pulmonary embolus. Evaluation of the lobar, segmental and subsegmental pulmonary arteries particularly within the right lung are nondiagnostic due to motion artifact. Mediastinum/Nodes: No enlarged axillary, mediastinal or hilar lymphadenopathy. Small hiatal hernia. Lungs/Pleura: Central airways are patent. Bandlike opacities within the lower lobes bilaterally. No large area pulmonary consolidation. No pleural effusion or pneumothorax. Upper Abdomen: Gallbladder is distended. Musculoskeletal: Thoracic spine degenerative changes. No aggressive or acute appearing osseous lesions. T12 and L1 vertebral body compression deformities, age indeterminate. Review of the MIP images confirms the above findings. IMPRESSION: Extensive motion artifact limits evaluation. No evidence for central or main pulmonary embolus. Evaluation of the lobar, segmental and subsegmental pulmonary arteries is nondiagnostic due to extensive motion artifact. Subpleural ground-glass and consolidative opacities within the lower lobes bilaterally favored to represent atelectasis. The gallbladder appears mildly distended. Recommend clinical correlation. Ultrasound can be performed as clinically indicated.  Aortic atherosclerosis. Dilated main pulmonary artery as can be seen with pulmonary arterial hypertension. Ascending thoracic aorta measures 4.3 cm. Recommend semi-annual imaging followup by CTA or MRA and referral to cardiothoracic surgery if not already obtained. This recommendation follows 2010 ACCF/AHA/AATS/ACR/ASA/SCA/SCAI/SIR/STS/SVM Guidelines for the Diagnosis and Management of Patients With Thoracic Aortic Disease. Circulation. 2010; 121: e266-e36 Age-indeterminate T12 and L1 compression deformities. Recommend correlation for point tenderness. Electronically Signed   By: Annia Belt M.D.   On: 03/19/2016 19:43   Dg Chest Portable 1  View  Result Date: 03/19/2016 CLINICAL DATA:  Acute onset of wet cough and bilateral lung crackles. Initial encounter. EXAM: PORTABLE CHEST 1 VIEW COMPARISON:  Chest radiograph performed 03/07/2016 FINDINGS: There is elevation of the right hemidiaphragm. The lungs are well-aerated. Mild peribronchial thickening is noted. There is no evidence of focal opacification, pleural effusion or pneumothorax. The cardiomediastinal silhouette is mildly enlarged. No acute osseous abnormalities are seen. Postoperative change is noted about the thyroid bed. IMPRESSION: Elevation of the right hemidiaphragm. Mild peribronchial thickening noted. Mild cardiomegaly. Electronically Signed   By: Roanna Raider M.D.   On: 03/19/2016 17:22   US Abdomen Limited Ruq  Result Date: 03/20/2016 CLINICAL DATA:  Elevated LFTs EXAM: US ABDOMEN LIMITED - RIGHT UPPER QUADRANT COMPARISON:  None. FINDINGS: Gallbladder: Layering small gallstones measuring up to 7 mm. Associated gallbladder sludge. No gallbladder wall thickening or pericholecystic fluid. Common bile duct: Diameter: 4 mm Liver: Poorly visualized.  No focal hepatic lesion is seen. IMPRESSION: Cholelithiasis, without associated sonographic findings to suggest acute cholecystitis. Electronically Signed   By: Charline Bills M.D.   On: 03/20/2016 09:37    EKG:   Orders placed or performed during the hospital encounter of 03/19/16  . ED EKG  . ED EKG  . EKG 12-Lead      Management plans discussed with the patient, family and they are in agreement.  CODE STATUS:     Code Status Orders        Start     Ordered   03/19/16 2319  Do not attempt resuscitation (DNR)  Continuous    Question Answer Comment  In the event of cardiac or respiratory ARREST Do not call a "code blue"   In the event of cardiac or respiratory ARREST Do not perform Intubation, CPR, defibrillation or ACLS   In the event of cardiac or respiratory ARREST Use medication by any route, position,  wound care, and other measures to relive pain and suffering. May use oxygen, suction and manual treatment of airway obstruction as needed for comfort.      03/19/16 2318    Code Status History    Date Active Date Inactive Code Status Order ID Comments User Context   03/19/2016 11:18 PM 03/19/2016 11:18 PM Full Code [id number]  [facility], DO Inpatient   03/06/2016  8:14 AM 03/09/2016  1:28 PM Full Code [id number]  Adrian Saran, MD ED   01/04/2016  5:05 PM 01/05/2016  4:53 PM Full Code [id number]  Altamese Dilling, MD Inpatient      TOTAL TIME TAKING CARE OF THIS PATIENT: 40 minutes.    Katharina Caper M.D on 03/21/2016 at 11:58 AM  Between 7am to 6pm - Pager - [phone]  After 6pm go to [email or web address] - password [email or web address] [facility]  [facility]  Office  [phone]  CC: Primary care physician; No PCP Per Patient

## 2016-03-21 NOTE — Progress Notes (Signed)
[facility]. Referral made aware. Thank you. Dayna BarkerKaren Robertson RN, BSN, [facility], hospital Liaison [phone]

## 2016-03-21 NOTE — Consult Note (Signed)
Consultation Note Date: 03/21/16  Patient Name: Joy Hill  DOB: July 08, 1925  MRN: [id number]  Age / Sex: 83 y.o., female  PCP: No Pcp Per Patient Referring Physician: No att. providers found  Reason for Consultation: Establishing goals of care  HPI/Patient Profile: 83 y.o. female  with past medical history of dementia, hypertension, narcolepsy, and atrial fibrillation admitted on 03/19/2016 with lethargy and decreased oral intake. Recent hospitalization for pneumonia/CHF and was discharged to nursing facility with oxygen and rocephin. Test positive for influenza B at nursing facility and has been on tamiflu x4 days. In ED, acute respiratory failure with unclear source ( ? Bronchitis, flu, and recent HCAP). Baseline dementia. Also with moderate to severe aortic stenosis. Palliative medicine consultation for goals of care.    Clinical Assessment and Goals of Care: I have reviewed medical records, discussed with RN, and met with patient and daughter at bedside to discuss diagnosis, GOC, EOL wishes, disposition and options.  Introduced Palliative Medicine as specialized medical care for people living with serious illness. It focuses on providing relief from the symptoms and stress of a serious illness. The goal is to improve quality of life for both the patient and the family.  We discussed a brief life review of the patient. Ms. Joy Hill has been an independent, hardworking, and stubborn individual. Daughter is an only child. Prior to hospitalization two weeks ago, she was living at assisted living facility. Diagnosed with dementia 3-4 years. She has been ambulatory and with good appetite. Since pneumonia and flu, daughter speaks of her decline with functional and nutritional status.    Discussed current hospitalization and contributing co-morbidities (dementia and aortic stenosis). Discussed natural trajectory of  dementia and cycle of decline with dehydration and infection as disease progresses. Also, my concern with her being at a new baseline after two hospitalizations for pneumonia and flu and deconditioning.   Advanced directives, concepts specific to code status, artifical feeding and hydration, and rehospitalization were considered and discussed. No POA paperwork or living will, but daughter confirms her mother has spoken her wishes on no resuscitation, life support, or feeding tube. I educated on MOST form and encouraged daughter to consider completing with a provider.   Daughter is hopeful for improvement at rehab facility but also seems realistic that time may be short if she is not able to sustain her nutritional status. Becomes very tearful during the conversation. Offered emotional support.   Palliative Care services outpatient were explained and offered. Daughter agrees with palliative to follow at facility.   SUMMARY OF RECOMMENDATIONS    Daughter confirms DNR/DNI and no feeding tube.   Provided her a MOST form--she is not ready to complete today.   Palliative to follow at discharge to SNF. Likely today.   Code Status/Advance Care Planning:  DNR   Symptom Management:   Per attending  Palliative Prophylaxis:   Aspiration, Delirium Protocol, Oral Care and Turn Reposition  Psycho-social/Spiritual:   Desire for further Chaplaincy support:no  Additional Recommendations: Caregiving  Support/Resources and Education on Hospice  Prognosis:   Unable to determine guarded with underlying dementia, severe aortic stenosis, and severe functional and nutritional status decline  Discharge Planning: [place] for rehab with Palliative care service follow-up      Primary Diagnoses: Present on Admission: . Acute respiratory failure with hypoxia ([place])   I have reviewed the medical record, interviewed the patient and family, and examined the patient. The following  aspects are pertinent.  Past Medical History:  Diagnosis Date  . Atrial fibrillation ([place])    seen on multiple prior EKGs  . Dementia   . Hypertension   . Narcolepsy    Social History   Social History  . Marital status: Divorced    Spouse name: N/A  . Number of children: N/A  . Years of education: N/A   Social History Main Topics  . Smoking status: Never Smoker  . Smokeless tobacco: Never Used  . Alcohol use No  . Drug use: No  . Sexual activity: Not Asked   Other Topics Concern  . None   Social History Narrative  . None   Family History  Problem Relation Age of Onset  . Breast cancer Paternal Grandmother    Scheduled Meds:  Continuous Infusions: PRN Meds:. Medications Prior to Admission:  Prior to Admission medications   Medication Sig Start Date End Date Taking? Authorizing Provider  acetaminophen (TYLENOL) 500 MG tablet Take 500 mg by mouth daily.   Yes Historical Provider, MD  albuterol (PROVENTIL HFA;VENTOLIN HFA) 108 (90 Base) MCG/ACT inhaler Inhale 2 puffs into the lungs every 4 (four) hours as needed for shortness of breath.  07/15/14  Yes Historical Provider, MD  aspirin EC 81 MG tablet Take by mouth daily.    Yes Historical Provider, MD  citalopram (CELEXA) 10 MG tablet Take 10 mg by mouth daily.   Yes Historical Provider, MD  dextroamphetamine (DEXEDRINE SPANSULE) 10 MG 24 hr capsule Take 10 mg by mouth daily.   Yes Historical Provider, MD  fluticasone (FLONASE) 50 MCG/ACT nasal spray Place 2 sprays into both nostrils daily. 03/03/16 03/03/17 Yes Historical Provider, MD  guaiFENesin (MUCINEX) 600 MG 12 hr tablet Take 600 mg by mouth every 12 (twelve) hours as needed for cough or congestion.   Yes Historical Provider, MD  ipratropium-albuterol (DUONEB) 0.5-2.5 (3) MG/3ML SOLN Take 3 mLs by nebulization every 4 (four) hours as needed.   Yes Historical Provider, MD  Lactobacillus (PROBIOTIC ACIDOPHILUS) TABS Take 1 tablet by mouth 2 (two) times daily.   Yes  Historical Provider, MD  nystatin (MYCOSTATIN/NYSTOP) powder Apply topically 2 (two) times daily. Apply under breast twice daily for redness. 11/24/15 11/23/16 Yes Historical Provider, MD  oseltamivir (TAMIFLU) 75 MG capsule Take 75 mg by mouth 2 (two) times daily.   Yes Historical Provider, MD  senna-docusate (SENOKOT-S) 8.6-50 MG tablet Take 1 tablet by mouth at bedtime as needed for mild constipation. 03/09/16  Yes Demetrios Loll, MD  feeding supplement, ENSURE ENLIVE, (ENSURE ENLIVE) LIQD Take 237 mLs by mouth 2 (two) times daily between meals. 03/21/16   Theodoro Grist, MD  furosemide (LASIX) 20 MG tablet Take 1 tablet (20 mg total) by mouth daily as needed. 03/21/16   Theodoro Grist, MD  oseltamivir (TAMIFLU) 30 MG capsule Take 1 capsule (30 mg total) by mouth 2 (two) times daily. 03/21/16   Theodoro Grist, MD  pregabalin (LYRICA) 25 MG capsule Take 1 capsule (25 mg total) by mouth 2 (two) times daily. 03/21/16   Theodoro Grist, MD   No  Known Allergies Review of Systems  Unable to perform ROS: Dementia    Physical Exam  Constitutional: She is easily aroused. She appears ill.  HENT:  Head: Normocephalic and atraumatic.  Cardiovascular: Regular rhythm.   Pulmonary/Chest: Effort normal. She has decreased breath sounds.  Abdominal: Normal appearance.  Neurological: She is easily aroused. She is disoriented.  Skin: Skin is warm and dry. There is pallor.  Psychiatric: Her speech is delayed. Cognition and memory are impaired. She is inattentive.  Nursing note and vitals reviewed.  Vital Signs: BP 127/78   Pulse (!) 108   Temp 99 F (37.2 C)   Resp 18   Ht 5' 3"  (1.6 m)   Wt 87.2 kg (192 lb 4.8 oz) Comment: bed scale  SpO2 94%   BMI 34.06 kg/m  Pain Assessment: No/denies pain   Pain Score: Asleep   SpO2: SpO2: 94 % O2 Device:SpO2: 94 % O2 Flow Rate: .O2 Flow Rate (L/min): 3 L/min  IO: Intake/output summary: No intake or output data in the 24 hours ending 03/22/16 1435  LBM: Last BM  Date: 03/21/16 Baseline Weight: Weight: 83 kg (183 lb) Most recent weight: Weight: 87.2 kg (192 lb 4.8 oz) (bed scale)     Palliative Assessment/Data: PPS 30%   Flowsheet Rows   Flowsheet Row Most Recent Value  Intake Tab  Referral Department  Hospitalist  Unit at Time of Referral  Oncology Unit  Palliative Care Primary Diagnosis  Cardiac  Date Notified  03/20/16  Palliative Care Type  New Palliative care  Reason for referral  Clarify Goals of Care  Date of Admission  03/19/16  Date first seen by Palliative Care  03/21/16  # of days IP prior to Palliative referral  1  Clinical Assessment  Palliative Performance Scale Score  30%  Psychosocial & Spiritual Assessment  Palliative Care Outcomes  Patient/Family meeting held?  Yes  Who was at the meeting?  patient and daughter  Palliative Care Outcomes  Clarified goals of care, ACP counseling assistance, Provided psychosocial or spiritual support, Linked to palliative care logitudinal support, Counseled regarding hospice      Time In: 1410 Time Out: 1520 Time Total: 33mn Greater than 50%  of this time was spent counseling and coordinating care related to the above assessment and plan.  Signed by:  MIhor Dow FNP-C Palliative Medicine Team  Phone: [phone]: [phone]  Please contact Palliative Medicine Team phone at [phone] questions and concerns.  For individual provider: See AShea Evans

## 2016-03-21 NOTE — Plan of Care (Signed)
[facility] [place]  [facility].  Patient and her daughter saw Palliative Care today.  She is just eating bites, is very tearful and gets very agitated and resistant when we try to reposition to change incontinence pad.  She repeats that she just wants to die and nobody cares about her and is very difficult to redirect.  Patient now having to have meds crushed in ice cream which she prefers to applesauce. Also diet changed to dysphagia I.  She has been running AFIB on tele. IV was removed and EMS was called for transport.

## 2016-03-22 DIAGNOSIS — G3183 Dementia with Lewy bodies: Secondary | ICD-10-CM

## 2016-03-22 DIAGNOSIS — Z7189 Other specified counseling: Secondary | ICD-10-CM

## 2016-03-22 DIAGNOSIS — Z515 Encounter for palliative care: Secondary | ICD-10-CM

## 2016-03-22 DIAGNOSIS — F0280 Dementia in other diseases classified elsewhere without behavioral disturbance: Secondary | ICD-10-CM

## 2016-03-26 DIAGNOSIS — E43 Unspecified severe protein-calorie malnutrition: Secondary | ICD-10-CM

## 2016-04-30 DEATH — deceased

## 2017-12-11 IMAGING — CT CT ANGIO CHEST
2 of 6 series · 18 of 46 positions shown · IV contrast (APPLIED)
Comparison: Chest radiograph 03/19/2016

CLINICAL DATA: Patient with cough and crackles.  Dementia.

EXAM:
CT ANGIOGRAPHY CHEST WITH CONTRAST
TECHNIQUE: Multidetector CT imaging of the chest was performed using the
standard protocol during bolus administration of intravenous
contrast. Multiplanar CT image reconstructions and MIPs were
obtained to evaluate the vascular anatomy.
CONTRAST:  75 cc Isovue 370

[Series 5: thins · axial · 0.81mm/px · z∈[-196,+52]mm · 16 of 272 slices shown]
[im 12/272  lung]
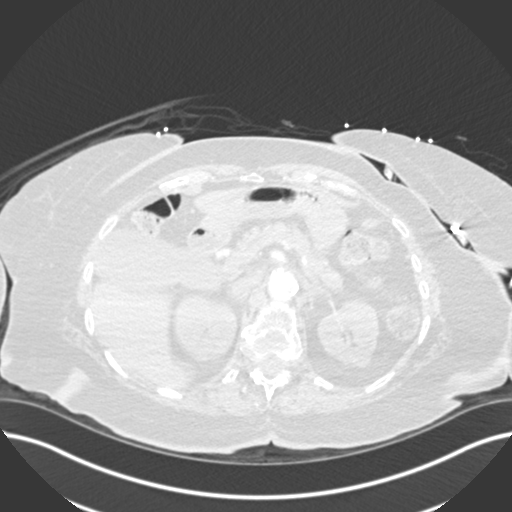
[im 36/272  soft-tissue]
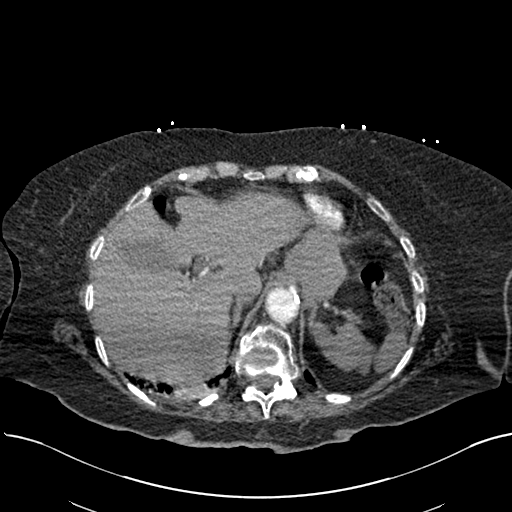
[im 48/272  lung]
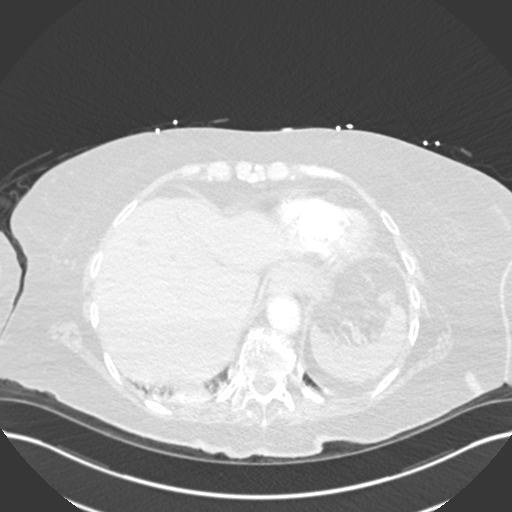
[im 59/272  soft-tissue]
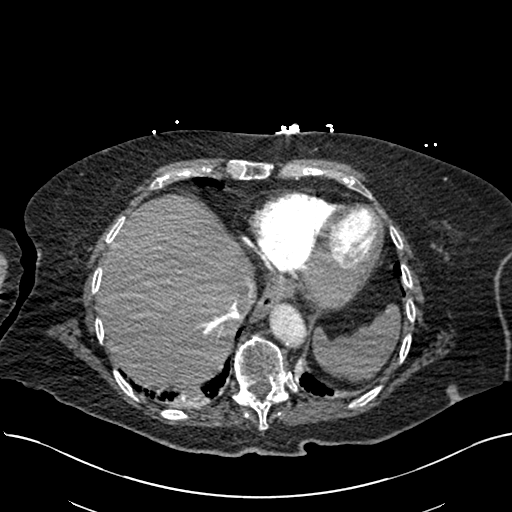
[im 83/272  lung]
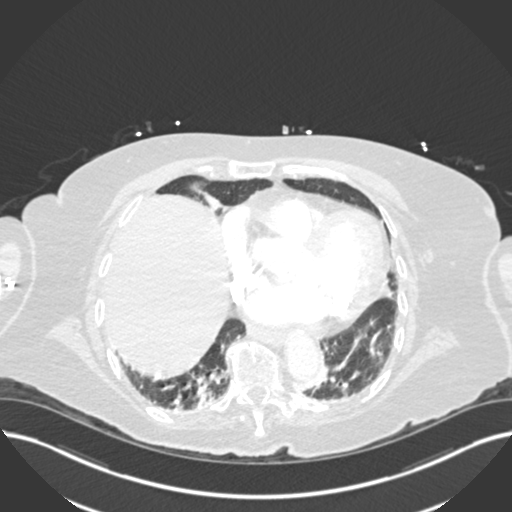
[im 95/272  soft-tissue]
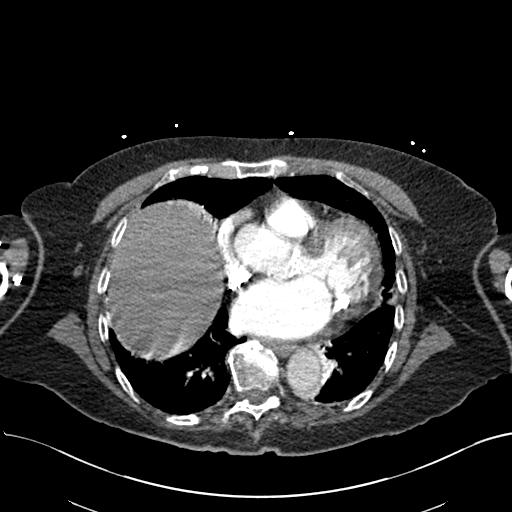
[im 107/272  lung]
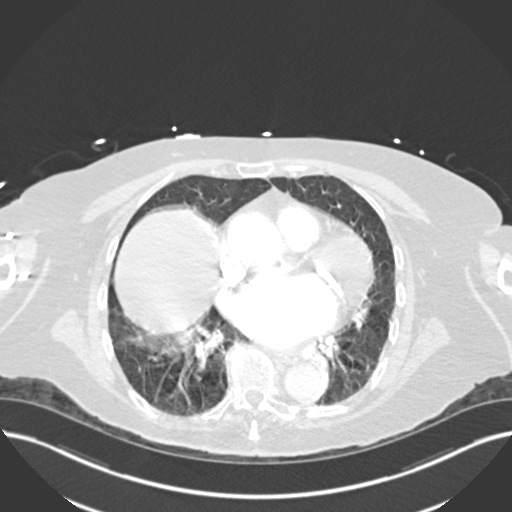
[im 130/272  soft-tissue]
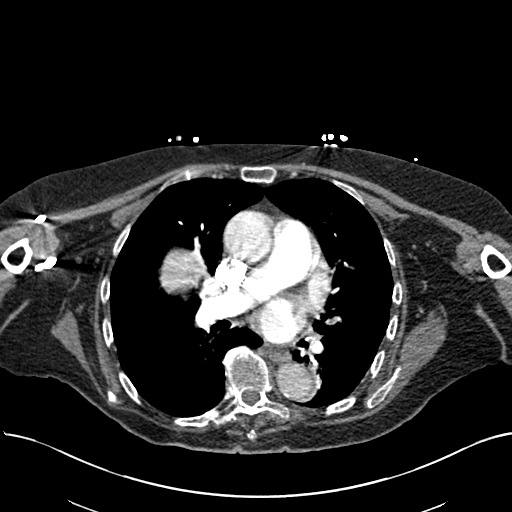
[im 142/272  lung]
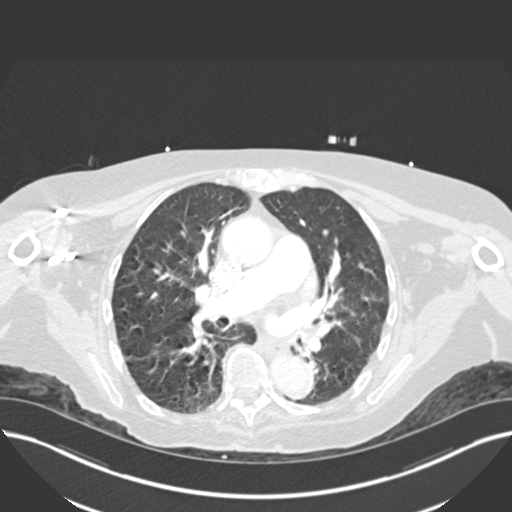
[im 165/272  soft-tissue]
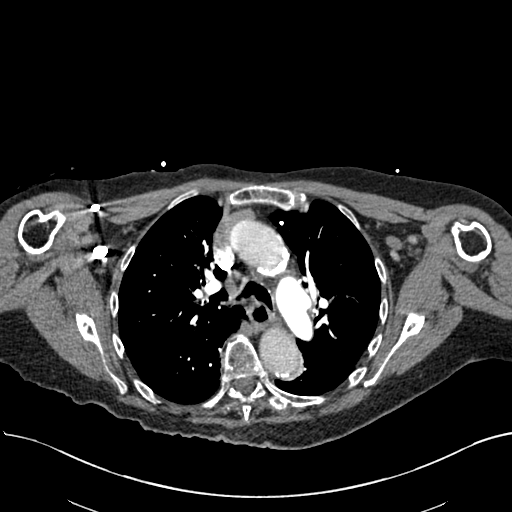
[im 177/272  lung]
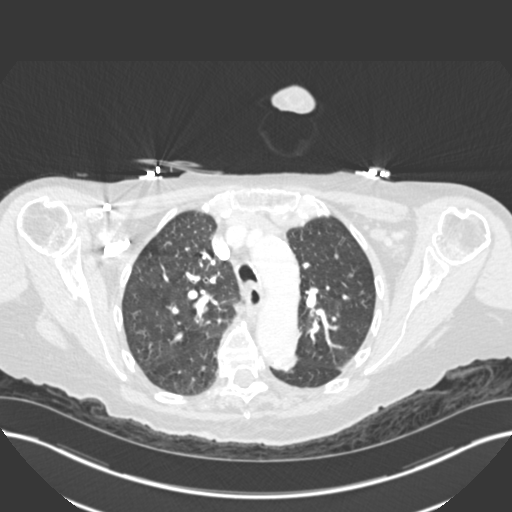
[im 189/272  soft-tissue]
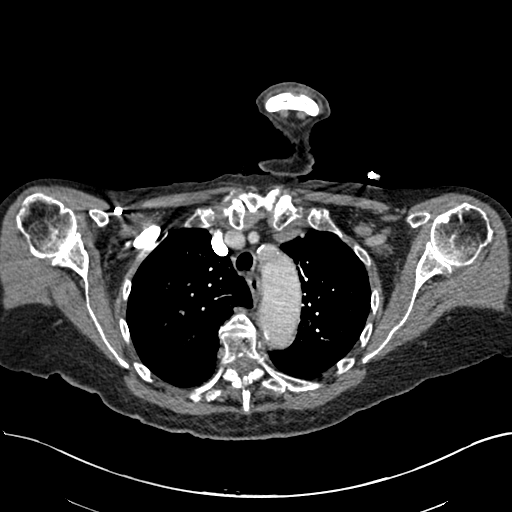
[im 213/272  lung]
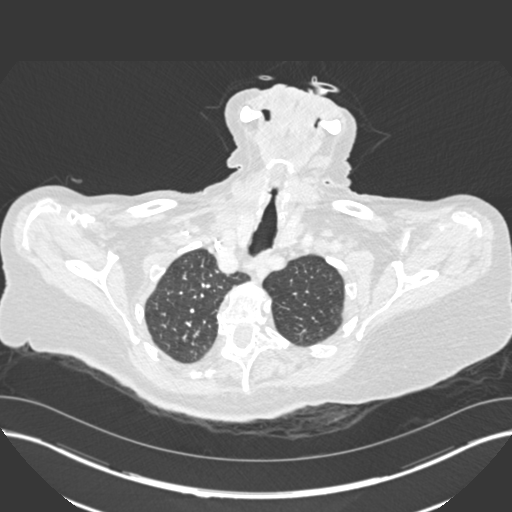
[im 224/272  soft-tissue]
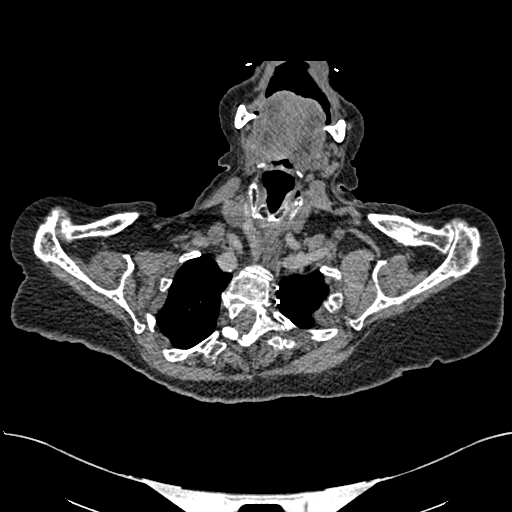
[im 236/272  lung]
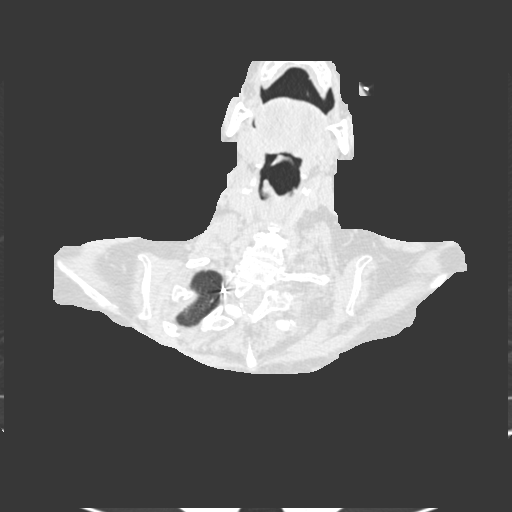
[im 260/272  soft-tissue]
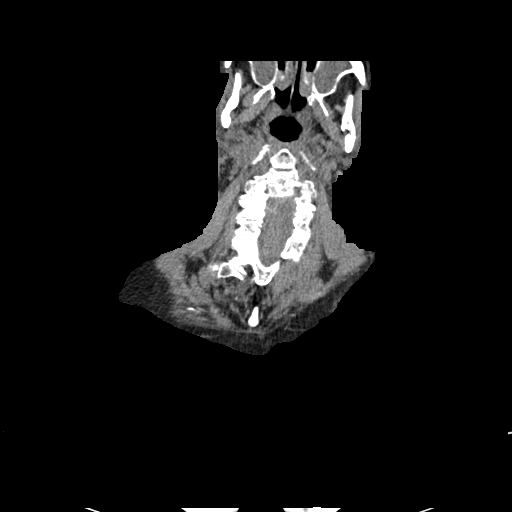

[Series 7: coronal mpr · coronal · 0.53mm/px · 2 of 84 slices shown]
[im 28/84  soft-tissue]
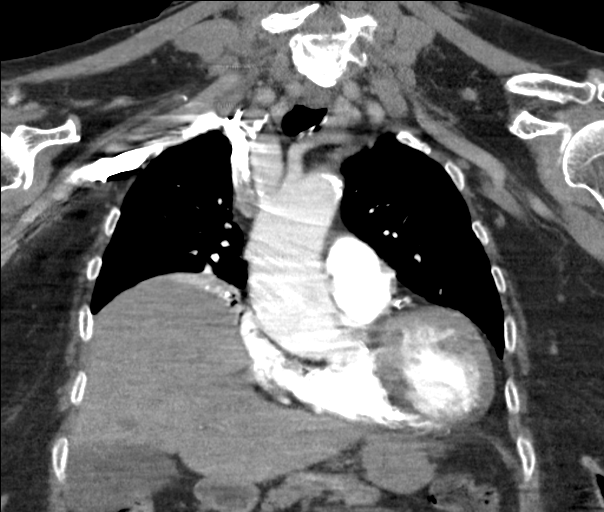
[im 56/84  soft-tissue]
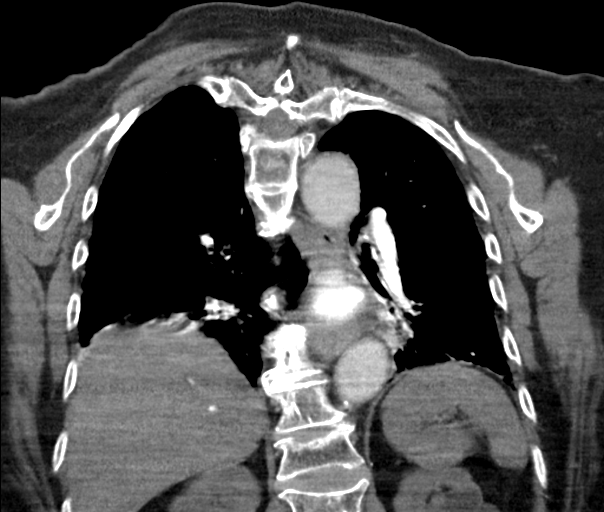

[18 of 46 positions shown; findings below may reference images not displayed]

FINDINGS: Cardiovascular: The heart is enlarged. No pericardial effusion. Main
pulmonary artery is dilated measuring 3.4 cm. Ascending thoracic
aorta measures 4.3 cm. Aortic atherosclerosis. Motion artifact
limits evaluation. No evidence for central or main pulmonary
embolus. Evaluation of the lobar, segmental and subsegmental
pulmonary arteries particularly within the right lung are
nondiagnostic due to motion artifact.

Mediastinum/Nodes: No enlarged axillary, mediastinal or hilar
lymphadenopathy. Small hiatal hernia.

Lungs/Pleura: Central airways are patent. Bandlike opacities within
the lower lobes bilaterally. No large area pulmonary consolidation.
No pleural effusion or pneumothorax.

Upper Abdomen: Gallbladder is distended.

Musculoskeletal: Thoracic spine degenerative changes. No aggressive
or acute appearing osseous lesions. T12 and L1 vertebral body
compression deformities, age indeterminate.

Review of the MIP images confirms the above findings.
IMPRESSION: Extensive motion artifact limits evaluation. No evidence for central
or main pulmonary embolus. Evaluation of the lobar, segmental and
subsegmental pulmonary arteries is nondiagnostic due to extensive
motion artifact.

Subpleural ground-glass and consolidative opacities within the lower
lobes bilaterally favored to represent atelectasis.

The gallbladder appears mildly distended. Recommend clinical
correlation. Ultrasound can be performed as clinically indicated.

Aortic atherosclerosis.

Dilated main pulmonary artery as can be seen with pulmonary arterial
hypertension.

Ascending thoracic aorta measures 4.3 cm. Recommend semi-annual
imaging followup by CTA or MRA and referral to cardiothoracic
surgery if not already obtained. This recommendation follows 2040
ACCF/AHA/AATS/ACR/ASA/SCA/GIORGI/TUN/SORIANO/LUKASZ Guidelines for the
Diagnosis and Management of Patients With Thoracic Aortic Disease.
Circulation. 2040; 121: [id number]

Age-indeterminate T12 and L1 compression deformities. Recommend
correlation for point tenderness.

## 2017-12-11 IMAGING — DX DG CHEST 1V PORT
1 series · 2 of 2 positions shown · non-contrast
Comparison: Chest radiograph performed 03/07/2016

CLINICAL DATA: Acute onset of wet cough and bilateral lung
crackles. Initial encounter.

EXAM:
PORTABLE CHEST 1 VIEW

[Series 1: chest ap · 0.14mm/px · 2 of 2 slices shown]
[im 1/2]
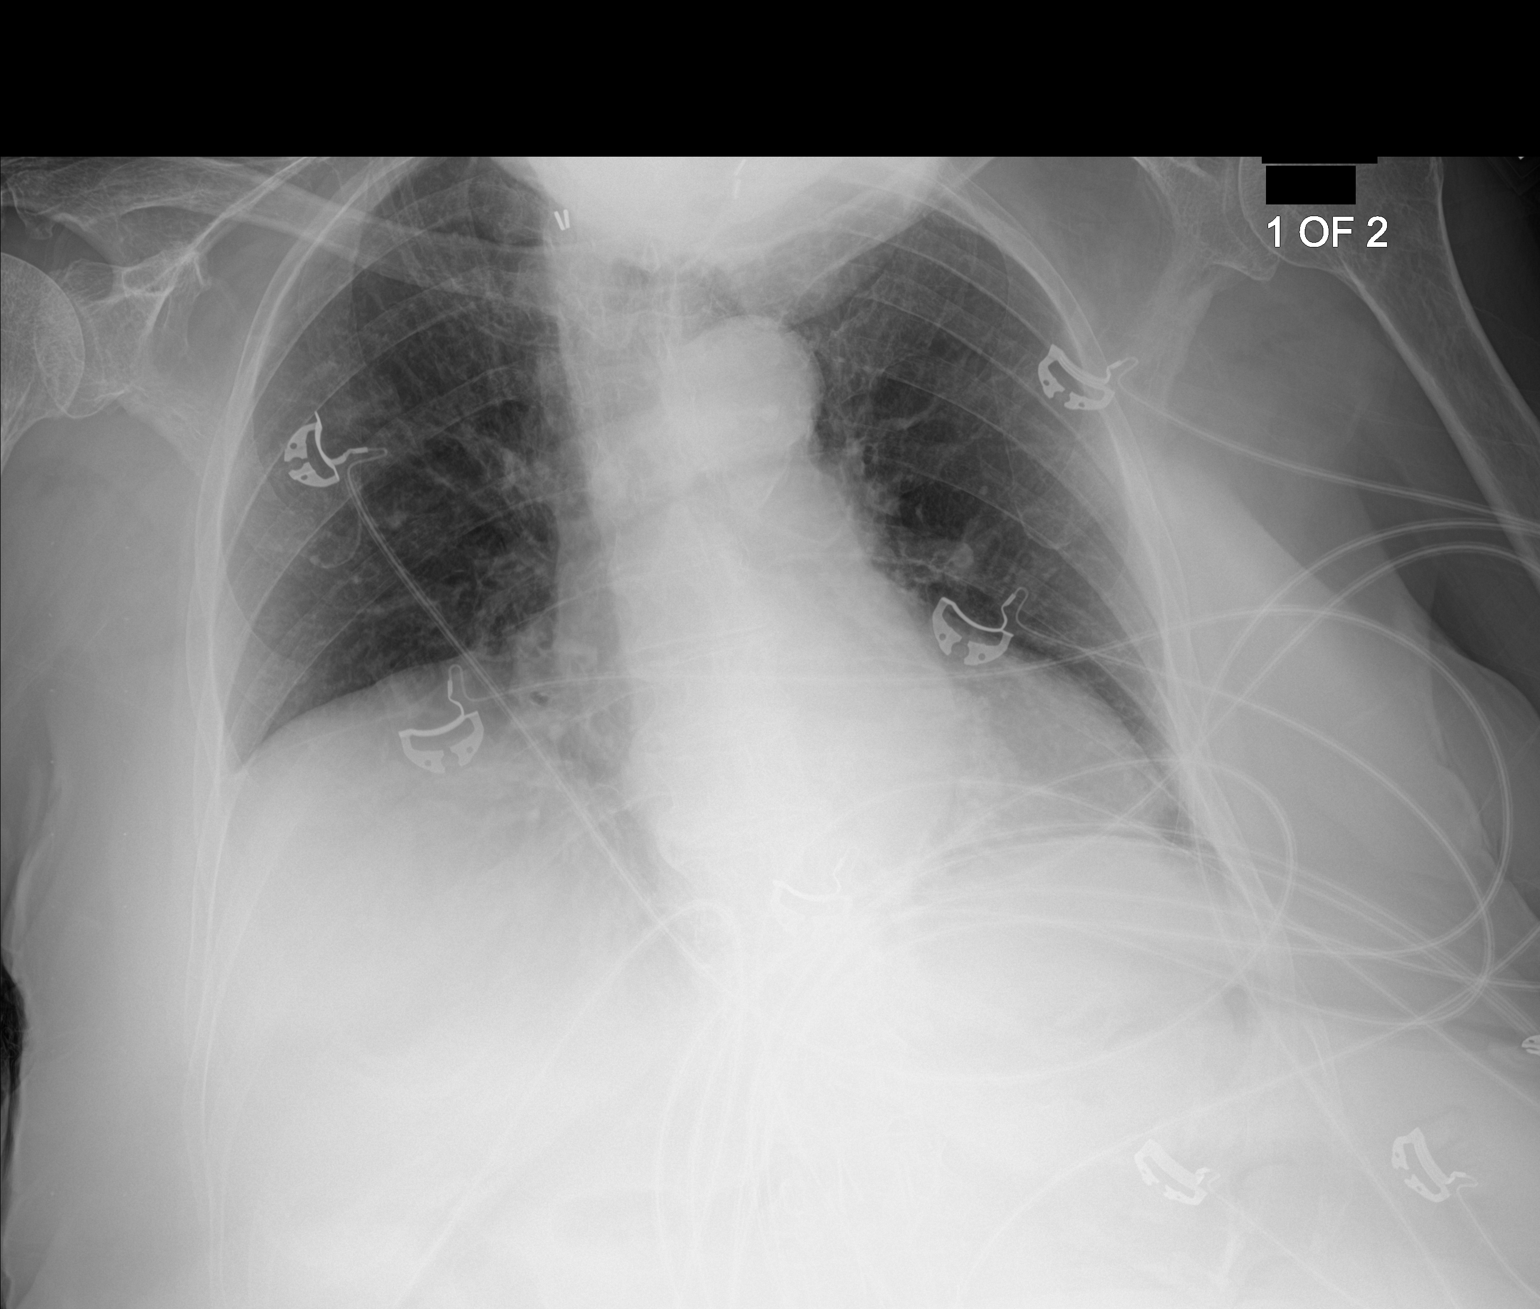
[im 2/2]
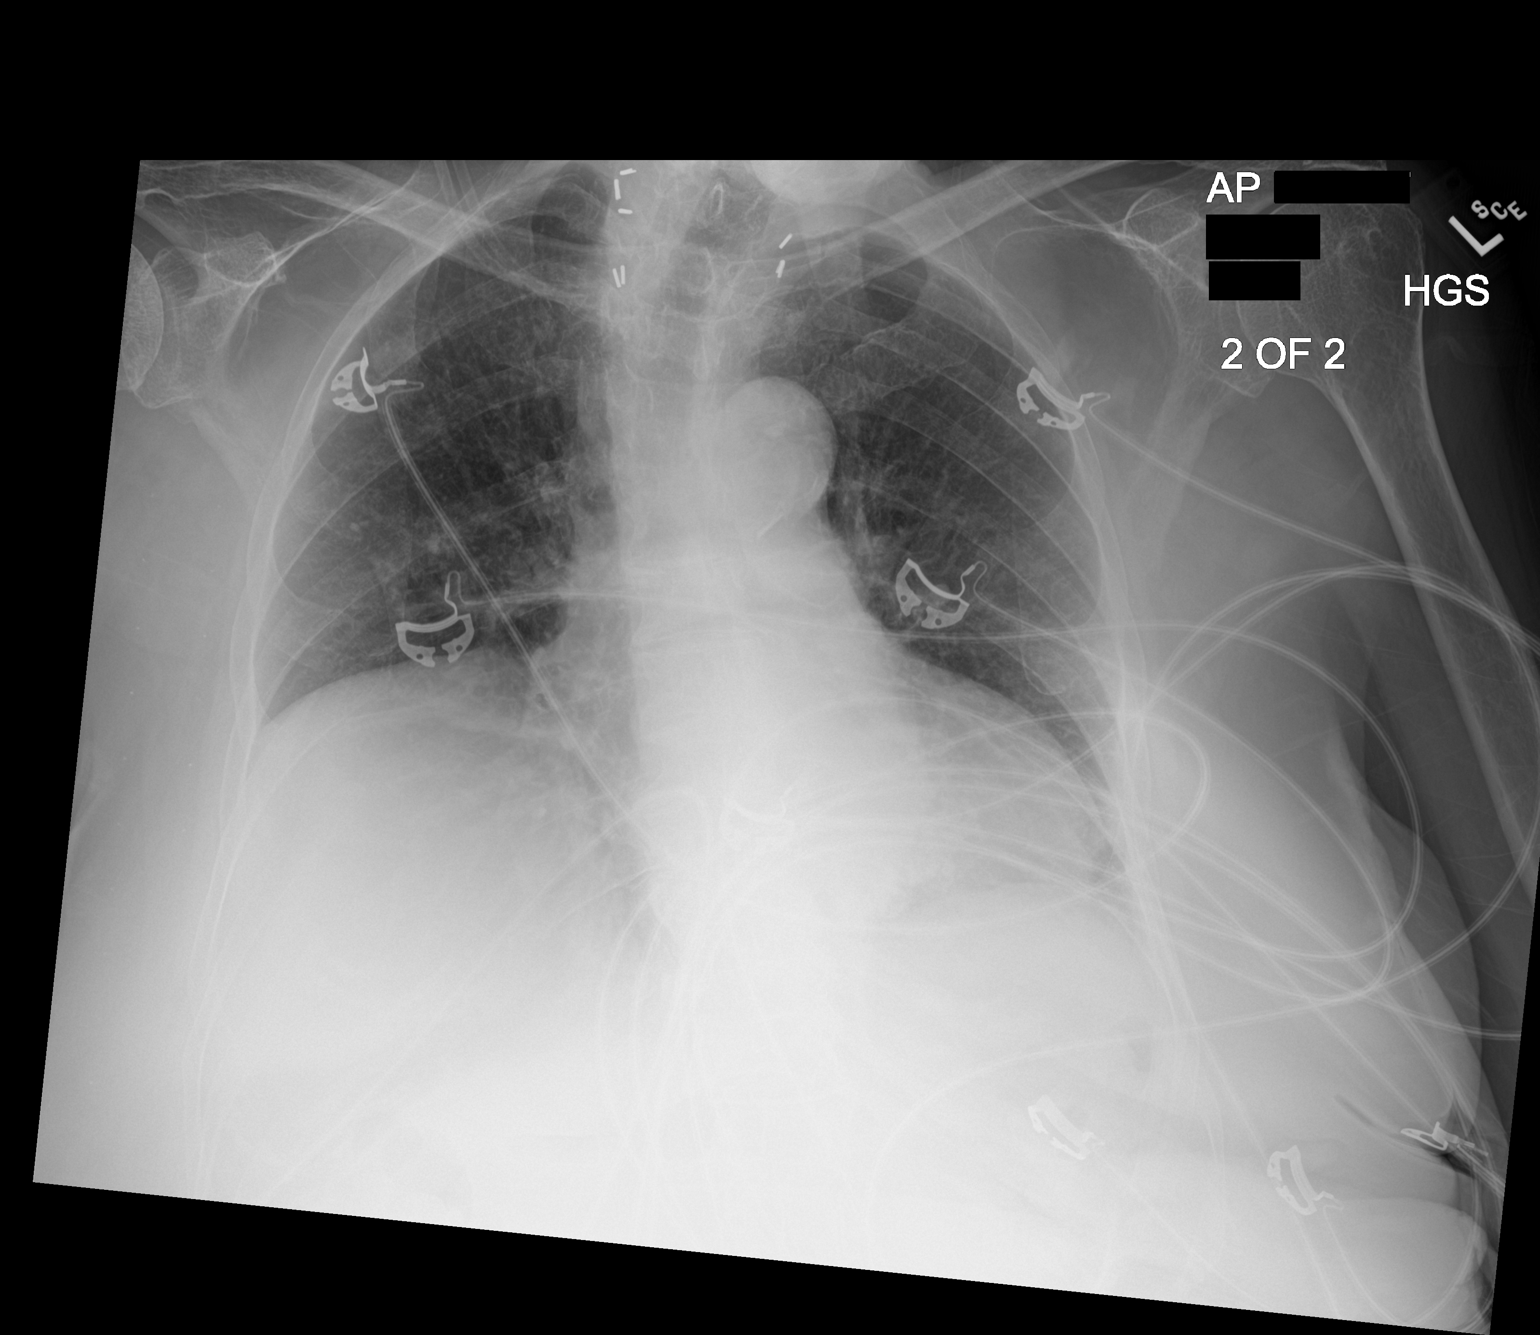

[2 of 2 positions shown; findings below may reference images not displayed]

FINDINGS: There is elevation of the right hemidiaphragm.

The lungs are well-aerated. Mild peribronchial thickening is noted.
There is no evidence of focal opacification, pleural effusion or
pneumothorax.

The cardiomediastinal silhouette is mildly enlarged. No acute
osseous abnormalities are seen. Postoperative change is noted about
the thyroid bed.
IMPRESSION: Elevation of the right hemidiaphragm. Mild peribronchial thickening
noted. Mild cardiomegaly.
